# Patient Record
Sex: Male | Born: 1965
Health system: Southern US, Community
[De-identification: ages and names within clinical notes are randomized; demographics above are authoritative.]

## PROBLEM LIST (undated history)

## (undated) DIAGNOSIS — E785 Hyperlipidemia, unspecified: Secondary | ICD-10-CM

## (undated) DIAGNOSIS — E7401 von Gierke disease: Secondary | ICD-10-CM

## (undated) DIAGNOSIS — J309 Allergic rhinitis, unspecified: Secondary | ICD-10-CM

## (undated) DIAGNOSIS — M1990 Unspecified osteoarthritis, unspecified site: Secondary | ICD-10-CM

## (undated) DIAGNOSIS — D551 Anemia due to other disorders of glutathione metabolism: Secondary | ICD-10-CM

## (undated) DIAGNOSIS — K219 Gastro-esophageal reflux disease without esophagitis: Secondary | ICD-10-CM

## (undated) DIAGNOSIS — T7840XA Allergy, unspecified, initial encounter: Secondary | ICD-10-CM

## (undated) DIAGNOSIS — F528 Other sexual dysfunction not due to a substance or known physiological condition: Secondary | ICD-10-CM

## (undated) DIAGNOSIS — I10 Essential (primary) hypertension: Secondary | ICD-10-CM

## (undated) HISTORY — DX: Unspecified osteoarthritis, unspecified site: M19.90

## (undated) HISTORY — PX: NASAL SEPTUM SURGERY: SHX37

## (undated) HISTORY — DX: Gastro-esophageal reflux disease without esophagitis: K21.9

## (undated) HISTORY — DX: Essential (primary) hypertension: I10

## (undated) HISTORY — DX: Allergic rhinitis, unspecified: J30.9

## (undated) HISTORY — DX: Allergy, unspecified, initial encounter: T78.40XA

## (undated) HISTORY — DX: Other sexual dysfunction not due to a substance or known physiological condition: F52.8

## (undated) HISTORY — DX: Anemia due to other disorders of glutathione metabolism: D55.1

## (undated) HISTORY — DX: Von Gierke disease: E74.01

## (undated) HISTORY — PX: KNEE SURGERY: SHX244

## (undated) HISTORY — DX: Hyperlipidemia, unspecified: E78.5

## (undated) HISTORY — PX: FRACTURE SURGERY: SHX138

---

## 2002-07-26 ENCOUNTER — Encounter: Payer: Self-pay | Admitting: Internal Medicine

## 2002-07-26 ENCOUNTER — Ambulatory Visit (HOSPITAL_COMMUNITY): Admission: RE | Admit: 2002-07-26 | Discharge: 2002-07-26 | Payer: Self-pay | Admitting: Internal Medicine

## 2004-04-26 ENCOUNTER — Ambulatory Visit (HOSPITAL_COMMUNITY): Admission: RE | Admit: 2004-04-26 | Discharge: 2004-04-26 | Payer: Self-pay | Admitting: Internal Medicine

## 2004-09-13 ENCOUNTER — Ambulatory Visit: Payer: Self-pay | Admitting: Internal Medicine

## 2004-09-15 ENCOUNTER — Ambulatory Visit (HOSPITAL_COMMUNITY): Admission: RE | Admit: 2004-09-15 | Discharge: 2004-09-15 | Payer: Self-pay | Admitting: Internal Medicine

## 2005-03-30 ENCOUNTER — Ambulatory Visit: Payer: Self-pay | Admitting: Internal Medicine

## 2005-11-16 ENCOUNTER — Ambulatory Visit: Payer: Self-pay | Admitting: Internal Medicine

## 2005-12-02 ENCOUNTER — Ambulatory Visit: Payer: Self-pay | Admitting: Internal Medicine

## 2005-12-07 ENCOUNTER — Encounter: Admission: RE | Admit: 2005-12-07 | Discharge: 2005-12-07 | Payer: Self-pay | Admitting: Specialist

## 2006-12-04 ENCOUNTER — Ambulatory Visit: Payer: Self-pay | Admitting: Internal Medicine

## 2006-12-04 LAB — CONVERTED CEMR LAB
ALT: 23 units/L (ref 0–40)
AST: 27 units/L (ref 0–37)
Albumin: 3.8 g/dL (ref 3.5–5.2)
Alkaline Phosphatase: 67 units/L (ref 39–117)
BUN: 16 mg/dL (ref 6–23)
Basophils Absolute: 0 10*3/uL (ref 0.0–0.1)
Basophils Relative: 0.3 % (ref 0.0–1.0)
Bilirubin Urine: NEGATIVE
Bilirubin, Direct: 0.2 mg/dL (ref 0.0–0.3)
CO2: 29 meq/L (ref 19–32)
Calcium: 9.6 mg/dL (ref 8.4–10.5)
Chloride: 104 meq/L (ref 96–112)
Cholesterol: 184 mg/dL (ref 0–200)
Creatinine, Ser: 1.2 mg/dL (ref 0.4–1.5)
Eosinophils Absolute: 0 10*3/uL (ref 0.0–0.6)
Eosinophils Relative: 1.6 % (ref 0.0–5.0)
GFR calc Af Amer: 86 mL/min
GFR calc non Af Amer: 71 mL/min
Glucose, Bld: 91 mg/dL (ref 70–99)
HCT: 43.7 % (ref 39.0–52.0)
HDL: 44.3 mg/dL (ref >39.0)
Hemoglobin, Urine: NEGATIVE
Hemoglobin: 15 g/dL (ref 13.0–17.0)
Ketones, ur: NEGATIVE mg/dL
LDL Cholesterol: 112 mg/dL — ABNORMAL HIGH (ref 0–99)
Leukocytes, UA: NEGATIVE
Lymphocytes Relative: 27 % (ref 12.0–46.0)
MCHC: 34.4 g/dL (ref 30.0–36.0)
MCV: 93.6 fL (ref 78.0–100.0)
Monocytes Absolute: 0.3 10*3/uL (ref 0.2–0.7)
Monocytes Relative: 10.6 % (ref 3.0–11.0)
Neutro Abs: 1.7 10*3/uL (ref 1.4–7.7)
Neutrophils Relative %: 60.5 % (ref 43.0–77.0)
Nitrite: NEGATIVE
PSA: 0.37 ng/mL
PSA: 0.37 ng/mL
PSA: 0.37 ng/mL (ref 0.10–4.00)
Platelets: 144 10*3/uL — ABNORMAL LOW (ref 150–400)
Potassium: 4.1 meq/L (ref 3.5–5.1)
RBC: 4.67 M/uL (ref 4.22–5.81)
RDW: 11.4 % — ABNORMAL LOW (ref 11.5–14.6)
Sodium: 139 meq/L (ref 135–145)
Specific Gravity, Urine: 1.02 (ref 1.000–1.03)
TSH: 1.34 microintl units/mL (ref 0.35–5.50)
Total Bilirubin: 0.8 mg/dL (ref 0.3–1.2)
Total CHOL/HDL Ratio: 4.2
Total Protein, Urine: NEGATIVE mg/dL
Total Protein: 7.1 g/dL (ref 6.0–8.3)
Triglycerides: 140 mg/dL (ref 0–149)
Urine Glucose: NEGATIVE mg/dL
Urobilinogen, UA: 0.2 (ref 0.0–1.0)
VLDL: 28 mg/dL (ref 0–40)
WBC: 2.7 10*3/uL — ABNORMAL LOW (ref 4.5–10.5)
pH: 5 (ref 5.0–8.0)

## 2007-05-12 ENCOUNTER — Encounter: Payer: Self-pay | Admitting: Internal Medicine

## 2007-05-12 DIAGNOSIS — D551 Anemia due to other disorders of glutathione metabolism: Secondary | ICD-10-CM

## 2007-05-12 DIAGNOSIS — F528 Other sexual dysfunction not due to a substance or known physiological condition: Secondary | ICD-10-CM

## 2007-05-12 DIAGNOSIS — S022XXA Fracture of nasal bones, initial encounter for closed fracture: Secondary | ICD-10-CM | POA: Insufficient documentation

## 2007-05-12 HISTORY — DX: Anemia due to other disorders of glutathione metabolism: D55.1

## 2007-05-12 HISTORY — DX: Other sexual dysfunction not due to a substance or known physiological condition: F52.8

## 2007-05-16 DIAGNOSIS — K219 Gastro-esophageal reflux disease without esophagitis: Secondary | ICD-10-CM

## 2007-05-16 DIAGNOSIS — J309 Allergic rhinitis, unspecified: Secondary | ICD-10-CM | POA: Insufficient documentation

## 2007-05-16 DIAGNOSIS — I10 Essential (primary) hypertension: Secondary | ICD-10-CM

## 2007-05-16 HISTORY — DX: Gastro-esophageal reflux disease without esophagitis: K21.9

## 2007-05-16 HISTORY — DX: Essential (primary) hypertension: I10

## 2007-05-16 HISTORY — DX: Allergic rhinitis, unspecified: J30.9

## 2007-05-29 ENCOUNTER — Ambulatory Visit: Payer: Self-pay | Admitting: Internal Medicine

## 2007-05-30 ENCOUNTER — Encounter: Payer: Self-pay | Admitting: Internal Medicine

## 2008-05-21 ENCOUNTER — Telehealth (INDEPENDENT_AMBULATORY_CARE_PROVIDER_SITE_OTHER): Payer: Self-pay | Admitting: *Deleted

## 2008-09-10 ENCOUNTER — Ambulatory Visit: Payer: Self-pay | Admitting: Internal Medicine

## 2008-09-10 DIAGNOSIS — E785 Hyperlipidemia, unspecified: Secondary | ICD-10-CM

## 2008-09-10 DIAGNOSIS — J019 Acute sinusitis, unspecified: Secondary | ICD-10-CM | POA: Insufficient documentation

## 2008-09-10 HISTORY — DX: Hyperlipidemia, unspecified: E78.5

## 2008-09-10 LAB — CONVERTED CEMR LAB
ALT: 19 units/L (ref 0–53)
AST: 27 units/L (ref 0–37)
Albumin: 3.9 g/dL (ref 3.5–5.2)
Alkaline Phosphatase: 55 units/L (ref 39–117)
BUN: 14 mg/dL (ref 6–23)
Basophils Absolute: 0 10*3/uL (ref 0.0–0.1)
Basophils Relative: 0.8 % (ref 0.0–3.0)
Bilirubin Urine: NEGATIVE
Bilirubin, Direct: 0.2 mg/dL (ref 0.0–0.3)
CO2: 32 meq/L (ref 19–32)
Calcium: 9.6 mg/dL (ref 8.4–10.5)
Chloride: 103 meq/L (ref 96–112)
Cholesterol: 159 mg/dL (ref 0–200)
Creatinine, Ser: 1.3 mg/dL (ref 0.4–1.5)
Eosinophils Absolute: 0.1 10*3/uL (ref 0.0–0.7)
Eosinophils Relative: 3.1 % (ref 0.0–5.0)
GFR calc Af Amer: 78 mL/min
GFR calc non Af Amer: 64 mL/min
Glucose, Bld: 102 mg/dL — ABNORMAL HIGH (ref 70–99)
HCT: 41.9 % (ref 39.0–52.0)
HDL: 48.8 mg/dL (ref >39.0)
Hemoglobin, Urine: NEGATIVE
Hemoglobin: 15.1 g/dL (ref 13.0–17.0)
Ketones, ur: NEGATIVE mg/dL
LDL Cholesterol: 97 mg/dL (ref 0–99)
Leukocytes, UA: NEGATIVE
Lymphocytes Relative: 24.1 % (ref 12.0–46.0)
MCHC: 36 g/dL (ref 30.0–36.0)
MCV: 96.4 fL (ref 78.0–100.0)
Monocytes Absolute: 0.3 10*3/uL (ref 0.1–1.0)
Monocytes Relative: 7.8 % (ref 3.0–12.0)
Neutro Abs: 2.9 10*3/uL (ref 1.4–7.7)
Neutrophils Relative %: 64.2 % (ref 43.0–77.0)
Nitrite: NEGATIVE
PSA: 0.47 ng/mL (ref 0.10–4.00)
Platelets: 162 10*3/uL (ref 150–400)
Potassium: 4.6 meq/L (ref 3.5–5.1)
RBC: 4.34 M/uL (ref 4.22–5.81)
RDW: 11.1 % — ABNORMAL LOW (ref 11.5–14.6)
Sodium: 141 meq/L (ref 135–145)
Specific Gravity, Urine: <=1.005 (ref 1.000–1.03)
TSH: 0.54 microintl units/mL (ref 0.35–5.50)
Total Bilirubin: 1.1 mg/dL (ref 0.3–1.2)
Total CHOL/HDL Ratio: 3.3
Total Protein, Urine: NEGATIVE mg/dL
Total Protein: 7 g/dL (ref 6.0–8.3)
Triglycerides: 64 mg/dL (ref 0–149)
Urine Glucose: NEGATIVE mg/dL
Urobilinogen, UA: 0.2 (ref 0.0–1.0)
VLDL: 13 mg/dL (ref 0–40)
WBC: 4.3 10*3/uL — ABNORMAL LOW (ref 4.5–10.5)
pH: 6.5 (ref 5.0–8.0)

## 2009-09-09 ENCOUNTER — Ambulatory Visit: Payer: Self-pay | Admitting: Internal Medicine

## 2010-02-23 ENCOUNTER — Ambulatory Visit: Payer: Self-pay | Admitting: Internal Medicine

## 2010-02-23 DIAGNOSIS — M654 Radial styloid tenosynovitis [de Quervain]: Secondary | ICD-10-CM | POA: Insufficient documentation

## 2010-03-01 ENCOUNTER — Encounter: Payer: Self-pay | Admitting: Internal Medicine

## 2010-03-01 ENCOUNTER — Telehealth: Payer: Self-pay | Admitting: Internal Medicine

## 2010-03-02 ENCOUNTER — Encounter: Payer: Self-pay | Admitting: Internal Medicine

## 2010-10-05 NOTE — Progress Notes (Signed)
Summary: Work note  Phone Note Call from Patient   Caller: Patient Summary of Call: Pt is requesting a new work note that will allow him to return to work without restriction starting tomorrow. Per pt his employer will not allow him to work with injury and there is no light duty. Pt says that he has an appointment with Ortho Wed 06/29 and if MD wants him to stay out of work he will but right now he can not afford to miss work.  Pt states that his wrist is still sore but with some swelling, not as bad as last week. Okay for release? Initial call taken by: Margaret Pyle, CMA,  March 01, 2010 3:26 PM  Follow-up for Phone Call        ok to return to work - I will do letter - done hardcopy to LIM side B - dahlia  Follow-up by: Corwin Levins MD,  March 01, 2010 5:26 PM  Additional Follow-up for Phone Call Additional follow up Details #1::        Pt informed, letter in cabinet for pt pick up Additional Follow-up by: Margaret Pyle, CMA,  March 02, 2010 7:55 AM

## 2010-10-05 NOTE — Letter (Signed)
Summary: Generic Letter  [facility]  [address] [place], [place] [address]   Phone: [phone]  Fax: [phone]    02/23/2010  KRISTA SOM [address] [place], [place]  [address]  Dear Mr. HAEN,       This letter to state that you should be allowed  to do light duty only for 10 calendar days, starting  February 24, 2010.  This means no lifting or pulling more  than 20 lbs.     Sincerely,   Oliver Barre MD

## 2010-10-05 NOTE — Assessment & Plan Note (Signed)
Summary: WRIST PAIN/NWS  #   Vital Signs:  Patient profile:   45 year old male Height:      69 inches (175.26 cm) Weight:      207.0 pounds (94.09 kg) BMI:     30.68 O2 Sat:      96 % on Room air Temp:     99.2 degrees F (37.33 degrees C) oral Pulse rate:   78 / minute BP sitting:   136 / 88  (left arm) Cuff size:   large  Vitals Entered By: Orlan Leavens (February 23, 2010 3:30 PM)  O2 Flow:  Room air  CC: (R) Wrist pain Is Patient Diabetic? No Pain Assessment Patient in pain? yes     Location: (R) wrist Type: aching/swollen Comments Pt states he is out of micardis. Have'nt been taking   CC:  (R) Wrist pain.  History of Present Illness: here with acute onset 1wk mild to mod pain and swelling to the right thumb and wrist with very physical job at work using hands and arms;  Pt denies CP, sob, doe, wheezing, orthopnea, pnd, worsening LE edema, palps, dizziness or syncope  Pt denies new neuro symptoms such as headache, facial or extremity weakness   Overall good med compliacne with meds, good tolerance.    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 1 Tab Daily 2)  Cephalexin 500 Mg Tabs (Cephalexin) .Marland Kitchen.. 1 By Mouth Three Times A Day  Current Medications (verified): 1)  Micardis 80 Mg Tabs (Telmisartan) .Marland Kitchen.. 1 Tab Daily 2)  Tramadol Hcl 50 Mg Tabs (Tramadol Hcl) .Marland Kitchen.. 1po Q 6 Hrs As Needed Pain 3)  Prednisone 10 Mg Tabs (Prednisone) .... 3po Qd For 3days, Then 2po Qd For 3days, Then 1po Qd For 3days, Then Stop  Allergies (verified): 1)  ! Asa 2)   ! Sulfa  Past History:  Past Medical History: Last updated: 09/10/2008 Hypertension Hx of G6PD deficiency Allergic rhinitis GERD Erectile Dysfunction Hx of Nose Fracture left AC joint DJD Hyperlipidemia hx of left knee meniscus tear  Past Surgical History: Last updated: 05/30/2007 Repair of deviated septum- age 38  Social History: Last updated: 09/10/2008 work - plant foreman - [employer] 3 children Married/separated Alcohol use-yes Never Smoked  Risk Factors: Smoking Status: never (09/10/2008)  Review of Systems       all otherwise negative per pt -    Physical Exam  General:  alert and well-developed - very muscular Head:  normocephalic and atraumatic.   Eyes:  vision grossly intact, pupils equal, and pupils round.   Ears:  R ear normal and L ear normal.   Nose:  no external deformity and no nasal discharge.   Mouth:  no gingival abnormalities and pharynx pink and moist.   Neck:  supple and no masses.   Lungs:  normal respiratory effort and normal breath sounds.   Heart:  normal rate and regular rhythm.   Msk:  right thumb and wrist with marked tender and swelling and warmth to the first dorsal compartment only - o/w neurovasc intact and no other MSK  abnormal Extremities:  no edema, no erythema  Neurologic:  strength normal in upper extremities and sensation intact to light touch.     Impression & Recommendations:  Problem # 1:  DE QUERVAIN'S TENOSYNOVITIS (ICD-727.04)  at least moderate to the right;  for light duty for 10 days, tx with prednisone taper off, and tramadol as needed;  for hand surgury eval as well as is likely to recur  Orders: Orthopedic Surgeon Referral (Ortho Surgeon)  Problem # 2:  HYPERTENSION (ICD-401.9)  His updated medication list for this problem includes:    Micardis 80 Mg Tabs (Telmisartan) .Marland Kitchen... 1 tab daily stable overall by hx and exam, ok to continue meds/tx as is   BP today: 136/88 Prior BP: 132/90  (09/10/2008)  Labs Reviewed: K+: 4.6 (09/10/2008) Creat: : 1.3 (09/10/2008)   Chol: 159 (09/10/2008)   HDL: 48.8 (09/10/2008)   LDL: 97 (09/10/2008)   TG: 64 (09/10/2008)  Complete Medication List: 1)  Micardis 80 Mg Tabs (Telmisartan) .Marland Kitchen.. 1 tab daily 2)  Tramadol Hcl 50 Mg Tabs (Tramadol hcl) .Marland Kitchen.. 1po q 6 hrs as needed pain 3)  Prednisone 10 Mg Tabs (Prednisone) .... 3po qd for 3days, then 2po qd for 3days, then 1po qd for 3days, then stop  Patient Instructions: 1)  Please take all new medications as prescribed 2)  Continue all previous medications as before this visit  3)  you are given the work note for light duty for 10 days 4)  Please schedule a follow-up appointment in 3 months with CPX labs Prescriptions: PREDNISONE 10 MG TABS (PREDNISONE) 3po qd for 3days, then 2po qd for 3days, then 1po qd for 3days, then stop  #18 x 0   Entered and Authorized by:   Corwin Levins MD   Signed by:   Corwin Levins MD on 02/23/2010   Method used:   Print then Give to Patient   RxID:   [id number] TRAMADOL HCL 50 MG TABS (TRAMADOL HCL) 1po q 6 hrs as needed pain  #50 x 1   Entered and Authorized by:   Corwin Levins MD   Signed by:   Corwin Levins MD on 02/23/2010   Method used:   Print then Give to Patient   RxID:   [id number]

## 2010-10-05 NOTE — Consult Note (Signed)
Summary: [facility]   Imported By: Sherian Rein 03/12/2010 08:19:48  _____________________________________________________________________  External Attachment:    Type:   Image     Comment:   External Document

## 2010-10-05 NOTE — Letter (Signed)
Summary: Generic Letter  [facility]  [address] [place], [place] [address]   Phone: [phone]  Fax: [phone]    03/01/2010  [facility] [address] [place], [place]  [address]  Dear Mr. MURATA,        You are OK to return to work as of March 02, 2010  without restriction.    Sincerely,   Oliver Barre MD

## 2011-12-10 ENCOUNTER — Encounter: Payer: Self-pay | Admitting: Internal Medicine

## 2011-12-10 DIAGNOSIS — Z0000 Encounter for general adult medical examination without abnormal findings: Secondary | ICD-10-CM

## 2011-12-10 DIAGNOSIS — Z0001 Encounter for general adult medical examination with abnormal findings: Secondary | ICD-10-CM | POA: Insufficient documentation

## 2011-12-15 ENCOUNTER — Ambulatory Visit (INDEPENDENT_AMBULATORY_CARE_PROVIDER_SITE_OTHER): Payer: BC Managed Care – PPO | Admitting: Internal Medicine

## 2011-12-15 ENCOUNTER — Encounter: Payer: Self-pay | Admitting: Internal Medicine

## 2011-12-15 ENCOUNTER — Telehealth: Payer: Self-pay

## 2011-12-15 ENCOUNTER — Other Ambulatory Visit (INDEPENDENT_AMBULATORY_CARE_PROVIDER_SITE_OTHER): Payer: BC Managed Care – PPO

## 2011-12-15 VITALS — BP 122/70 | HR 58 | Temp 99.1°F | Ht 72.0 in | Wt 205.5 lb

## 2011-12-15 DIAGNOSIS — Z0000 Encounter for general adult medical examination without abnormal findings: Secondary | ICD-10-CM

## 2011-12-15 LAB — CBC WITH DIFFERENTIAL/PLATELET
Basophils Relative: 0.8 % (ref 0.0–3.0)
Eosinophils Absolute: 0 10*3/uL (ref 0.0–0.7)
HCT: 42.4 % (ref 39.0–52.0)
Hemoglobin: 14.4 g/dL (ref 13.0–17.0)
Lymphs Abs: 1 10*3/uL (ref 0.7–4.0)
MCHC: 33.9 g/dL (ref 30.0–36.0)
MCV: 95.9 fl (ref 78.0–100.0)
Monocytes Absolute: 0.2 10*3/uL (ref 0.1–1.0)
Neutro Abs: 1.6 10*3/uL (ref 1.4–7.7)
RBC: 4.42 Mil/uL (ref 4.22–5.81)
RDW: 11.9 % (ref 11.5–14.6)

## 2011-12-15 LAB — LIPID PANEL
Total CHOL/HDL Ratio: 3
Triglycerides: 25 mg/dL (ref 0.0–149.0)

## 2011-12-15 LAB — BASIC METABOLIC PANEL
BUN: 30 mg/dL — ABNORMAL HIGH (ref 6–23)
Calcium: 9.3 mg/dL (ref 8.4–10.5)
Chloride: 101 mEq/L (ref 96–112)
Creatinine, Ser: 1.3 mg/dL (ref 0.4–1.5)

## 2011-12-15 LAB — URINALYSIS, ROUTINE W REFLEX MICROSCOPIC
Bilirubin Urine: NEGATIVE
Hgb urine dipstick: NEGATIVE
Leukocytes, UA: NEGATIVE
Nitrite: NEGATIVE
pH: 6 (ref 5.0–8.0)

## 2011-12-15 LAB — HEPATIC FUNCTION PANEL
ALT: 33 U/L (ref 0–53)
Total Bilirubin: 1.2 mg/dL (ref 0.3–1.2)

## 2011-12-15 LAB — PSA: PSA: 0.34 ng/mL (ref 0.10–4.00)

## 2011-12-15 LAB — TSH: TSH: 1.47 u[IU]/mL (ref 0.35–5.50)

## 2011-12-15 NOTE — Patient Instructions (Signed)
No new medications needed today. You are otherwise up to date with prevention as well Please return in 1 year for your yearly visit, or sooner if needed, with Lab testing done 3-5 days before

## 2011-12-15 NOTE — Progress Notes (Signed)
Subjective:    Patient ID: Jimmy Hess, male    DOB: [birth year], 46 y.o.   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  Pt denies neurological change such as new Headache, facial or extremity weakness.  Pt denies polydipsia, polyuria, or low sugar symptoms. Pt states overall good compliance with treatment and medications, good tolerability, and trying to follow lower cholesterol diet.  Pt denies worsening depressive symptoms, suicidal ideation or panic. No fever, wt loss, night sweats, loss of appetite, or other constitutional symptoms.  Pt states good ability with ADL's, low fall risk, home safety reviewed and adequate, no significant changes in hearing or vision.   Past Medical History  Diagnosis Date  . HYPERTENSION 05/16/2007    Qualifier: Diagnosis of  By: Jonny Ruiz MD, Len Blalock   . HYPERLIPIDEMIA 09/10/2008    Qualifier: Diagnosis of  By: Jonny Ruiz MD, Len Blalock   . GERD 05/16/2007    Qualifier: Diagnosis of  By: Jonny Ruiz MD, Len Blalock   . ALLERGIC RHINITIS 05/16/2007    Qualifier: Diagnosis of  By: Jonny Ruiz MD, Len Blalock   . GLUCOSE-6-PHOSPHATE DEHYDROGENASE DEFICIENCY 05/12/2007    Qualifier: Diagnosis of  By: Maris Berger   . ERECTILE DYSFUNCTION 05/12/2007    Qualifier: Diagnosis of  By: Jonny Ruiz MD, Len Blalock    Past Surgical History  Procedure Date  . Nasal septum surgery     reports that he has never smoked. He does not have any smokeless tobacco history on file. He reports that he drinks alcohol. He reports that he does not use illicit drugs. family history includes Cancer in his father; Heart failure in his maternal grandmother; and Hypertension in his maternal grandmother and mother. Allergies  Allergen  Reactions  . Aspirin   . Sulfonamide Derivatives    No current outpatient prescriptions on file prior to visit.   Review of Systems Review of Systems  Constitutional: Negative for diaphoresis, activity change, appetite change and unexpected weight change.  HENT: Negative for hearing loss, ear pain, facial swelling, mouth sores and neck stiffness.   Eyes: Negative for pain, redness and visual disturbance.  Respiratory: Negative for shortness of breath and wheezing.   Cardiovascular: Negative for chest pain and palpitations.  Gastrointestinal: Negative for diarrhea, blood in stool, abdominal distention and rectal pain.  Genitourinary: Negative for hematuria, flank pain and decreased urine volume.  Musculoskeletal: Negative for myalgias and joint swelling.  Skin: Negative for color change and wound.  Neurological: Negative for syncope and numbness.  Hematological: Negative for adenopathy.  Psychiatric/Behavioral: Negative for hallucinations, self-injury, decreased concentration and agitation.      Objective:   Physical Exam BP 122/70  Pulse 58  Temp(Src) 99.1 F (37.3 C) (Oral)  Ht 6' (1.829 m)  Wt 205 lb 8 oz (93.214 kg)  BMI 27.87 kg/m2  SpO2 97% Physical Exam  VS noted Constitutional: Pt is oriented to person, place, and time. Appears well-developed and well-nourished. /muscular/nonobese HENT:  Head: Normocephalic and atraumatic.  Right Ear: External ear normal.  Left Ear: External ear normal.  Nose: Nose normal.  Mouth/Throat: Oropharynx is clear and moist.  Eyes: Conjunctivae and EOM are normal. Pupils are equal, round, and reactive to light.  Neck: Normal range of  motion. Neck supple. No JVD present. No tracheal deviation present.  Cardiovascular: Normal rate, regular rhythm, normal heart sounds and intact distal pulses.   Pulmonary/Chest: Effort normal and breath sounds normal.  Abdominal: Soft. Bowel sounds are normal. There is no tenderness.  Musculoskeletal: Normal  range of motion. Exhibits no edema.  Lymphadenopathy:  Has no cervical adenopathy.  Neurological: Pt is alert and oriented to person, place, and time. Pt has normal reflexes. No cranial nerve deficit.  Skin: Skin is warm and dry. No rash noted.  Psychiatric:  Has  normal mood and affect. Behavior is normal.     Assessment & Plan:

## 2011-12-15 NOTE — Assessment & Plan Note (Addendum)
Overall doing well, age appropriate education and counseling updated, referrals for preventative services and immunizations addressed, dietary and smoking counseling addressed, most recent labs and ECG reviewed.  I have personally reviewed and have noted: 1) the patient's medical and social history 2) The pt's use of alcohol, tobacco, and illicit drugs 3) The patient's current medications and supplements 4) Functional ability including ADL's, fall risk, home safety risk, hearing and visual impairment 5) Diet and physical activities 6) Evidence for depression or mood disorder 7) The patient's height, weight, and BMI have been recorded in the chart I have made referrals, and provided counseling and education based on review of the above ECG reviewed as per emr Lab Results  Component Value Date   WBC 2.9* 12/15/2011   HGB 14.4 12/15/2011   HCT 42.4 12/15/2011   PLT 130.0* 12/15/2011   GLUCOSE 82 12/15/2011   CHOL 116 12/15/2011   TRIG 25.0 12/15/2011   HDL 41.50 12/15/2011   LDLCALC 70 12/15/2011   ALT 33 12/15/2011   AST 34 12/15/2011   NA 137 12/15/2011   K 4.4 12/15/2011   CL 101 12/15/2011   CREATININE 1.3 12/15/2011   BUN 30* 12/15/2011   CO2 29 12/15/2011   TSH 1.47 12/15/2011   PSA 0.34 12/15/2011

## 2011-12-15 NOTE — Telephone Encounter (Signed)
Put lab order in for physical labs. 

## 2012-05-15 ENCOUNTER — Ambulatory Visit: Payer: BC Managed Care – PPO | Admitting: Internal Medicine

## 2012-07-02 ENCOUNTER — Telehealth: Payer: Self-pay | Admitting: Internal Medicine

## 2012-07-02 ENCOUNTER — Ambulatory Visit (INDEPENDENT_AMBULATORY_CARE_PROVIDER_SITE_OTHER)
Admission: RE | Admit: 2012-07-02 | Discharge: 2012-07-02 | Disposition: A | Discharge status: Home / Self Care | Payer: BC Managed Care – PPO | Source: Ambulatory Visit | Attending: Internal Medicine | Admitting: Internal Medicine

## 2012-07-02 ENCOUNTER — Ambulatory Visit (INDEPENDENT_AMBULATORY_CARE_PROVIDER_SITE_OTHER): Payer: BC Managed Care – PPO | Admitting: Internal Medicine

## 2012-07-02 ENCOUNTER — Encounter: Payer: Self-pay | Admitting: Internal Medicine

## 2012-07-02 VITALS — BP 122/80 | HR 75 | Temp 100.0°F | Ht 72.0 in | Wt 231.1 lb

## 2012-07-02 DIAGNOSIS — M25572 Pain in left ankle and joints of left foot: Secondary | ICD-10-CM

## 2012-07-02 DIAGNOSIS — L0390 Cellulitis, unspecified: Secondary | ICD-10-CM | POA: Insufficient documentation

## 2012-07-02 DIAGNOSIS — M25579 Pain in unspecified ankle and joints of unspecified foot: Secondary | ICD-10-CM

## 2012-07-02 DIAGNOSIS — S82409A Unspecified fracture of shaft of unspecified fibula, initial encounter for closed fracture: Secondary | ICD-10-CM

## 2012-07-02 DIAGNOSIS — L0291 Cutaneous abscess, unspecified: Secondary | ICD-10-CM

## 2012-07-02 DIAGNOSIS — I10 Essential (primary) hypertension: Secondary | ICD-10-CM

## 2012-07-02 MED ORDER — CEPHALEXIN 500 MG PO CAPS: 500.0000 mg | ORAL_CAPSULE | Freq: Four times a day (QID) | ORAL | Status: DC

## 2012-07-02 MED ORDER — TRAMADOL HCL 50 MG PO TABS: 50.0000 mg | ORAL_TABLET | Freq: Four times a day (QID) | ORAL | Status: DC | PRN

## 2012-07-02 NOTE — Progress Notes (Signed)
Subjective:    Patient ID: Jimmy Hess, male    DOB: Aug 19, 1966, 46 y.o.   MRN: [id number]  HPI Here after fall out of a deer stand 2 days ago, catching the pant leg on the way and severe inversion of the left ankle.  Has used ACE wrap and icing several times per day, went to UC yesterday but wait was 2 hrs so did not stay.  Today with mod to severe pain on ambulation with marked swelling, worse since yesterday also with redness, tenderness going up the left distal leg above the ankle as well.  Unaware of any fever, no chills. Pt denies chest pain, increased sob or doe, wheezing, orthopnea, PND, increased LE swelling, palpitations, dizziness or syncope.  Pt denies new neurological symptoms such as new headache, or facial or extremity weakness or numbness  Has no hx of similar in the past.  No signficant other ortho issues Past Medical History  Diagnosis Date  . HYPERTENSION 05/16/2007    Qualifier: Diagnosis of  By: Jonny Ruiz MD, Len Blalock   . HYPERLIPIDEMIA 09/10/2008    Qualifier: Diagnosis of  By: Jonny Ruiz MD, Len Blalock   . GERD 05/16/2007    Qualifier: Diagnosis of  By: Jonny Ruiz MD, Len Blalock   . ALLERGIC RHINITIS 05/16/2007    Qualifier: Diagnosis of  By: Jonny Ruiz MD, Len Blalock   . GLUCOSE-6-PHOSPHATE DEHYDROGENASE DEFICIENCY 05/12/2007    Qualifier: Diagnosis of  By: Maris Berger   . ERECTILE DYSFUNCTION 05/12/2007    Qualifier: Diagnosis of  By: Jonny Ruiz MD, Len Blalock    Past Surgical History  Procedure Date  . Nasal septum surgery     reports that he has never smoked. He does not have any smokeless tobacco history on file. He reports that he drinks alcohol. He reports that he does not use illicit drugs. family history includes Cancer in his father; Heart failure in his maternal grandmother; and Hypertension in his maternal grandmother and mother. Allergies  Allergen Reactions  . Aspirin   . Sulfonamide Derivatives    No current outpatient prescriptions on file prior to visit.   Review of Systems  Constitutional: Negative for diaphoresis and unexpected weight change.  HENT: Negative for tinnitus.   Eyes: Negative for photophobia and visual disturbance.  Respiratory: Negative for choking and stridor.   Gastrointestinal: Negative for vomiting and blood in stool.  Genitourinary: Negative for hematuria and decreased urine volume.  Musculoskeletal: Negative for gait problem.  Skin: Negative for color change and wound.  Neurological: Negative for tremors and numbness.  Psychiatric/Behavioral: Negative for decreased concentration. The patient is not hyperactive.       Objective:   Physical Exam BP 122/80  Pulse 75  Temp 100 F (37.8 C) (Oral)  Ht 6' (1.829 m)  Wt 231 lb 2 oz (104.838 kg)  BMI 31.35 kg/m2  SpO2 97% Physical Exam  VS noted Constitutional: Pt appears well-developed and well-nourished.  HENT: Head: Normocephalic.  Right Ear: External ear normal.  Left Ear: External ear normal.  Eyes: Conjunctivae and EOM are normal. Pupils are equal, round, and reactive to light.  Neck: Normal range of motion. Neck supple.  Cardiovascular: Normal rate and regular rhythm.   Pulmonary/Chest: Effort normal and breath sounds normal.  Abd:  Soft, NT, non-distended, + BS Neurological: Pt is alert. Not confused  Skin: Skin is warm. No erythema.  Psychiatric: Pt behavior is normal. Thought content normal.  Left ankle with severe swelling/tender worst over the lateral malleolus with  some mild bruising; also some erythema to the area extending to the lower third of the lateral distal leg as well;  LLE o/w neurovasc intact; marked limp to walking, crutches are in the car    Assessment & Plan:

## 2012-07-02 NOTE — Assessment & Plan Note (Signed)
stable overall by hx and exam, most recent data reviewed with pt, and pt to continue medical treatment as before BP Readings from Last 3 Encounters:  07/02/12 122/80  12/15/11 122/70  02/23/10 136/88

## 2012-07-02 NOTE — Patient Instructions (Addendum)
Take all new medications as prescribed  - the pain medication, and antibiotic Continue all other medications as before Please use the ice three times per day at 15 min each to the left lateral ankle for another day Please continue the ACE wrap, and you could consider a soft shoe cast at the pharmacy or medical supply (such as [facility] on [address]) Please go to XRAY in the Basement for the x-ray test You will be contacted by phone if any changes need to be made immediately.  Otherwise, you will receive a letter about your results with an explanation. Please remember to sign up for My Chart at your earliest convenience, as this will be important to you in the future with finding out test results. If there is a fracture, you will be called with referral to orthopedic You are given the work note today

## 2012-07-02 NOTE — Assessment & Plan Note (Signed)
Mild to mod, for antibx course,  to f/u any worsening symptoms or concerns 

## 2012-07-02 NOTE — Assessment & Plan Note (Signed)
Likely severe sprain/strain vs ankle fx - for film today, pain med, consider ortho

## 2012-07-02 NOTE — Telephone Encounter (Signed)
Caller: Mckade/Patient; PCP: Oliver Barre (Adults only); CB#: [phone]; Call regarding Injury/Trauma;   Today, 07/02/2012 , pt calling wanting appt to be seen stating on 06/30/2012  he fell out of tree stand and foot got caught in foot peg. He has  left ankle / foot swelling with pain. Is able to walk , but not putting full weight. He has  been placing ice on  the ankle and  wrapped with  ace bandage. RN reached See in 4 hours for new marked swelling per Foot Injury protocol. Pt stating he already has a appt with his allergist at 1145 this morning. RN scheduled appt today at 1115 with Dr. Jonny Ruiz and pt stated he will call to rearrange his allergist  appt and call back if needed.

## 2012-12-17 ENCOUNTER — Encounter: Payer: BC Managed Care – PPO | Admitting: Internal Medicine

## 2012-12-17 DIAGNOSIS — Z0289 Encounter for other administrative examinations: Secondary | ICD-10-CM

## 2013-06-20 ENCOUNTER — Encounter: Payer: Self-pay | Admitting: Internal Medicine

## 2013-06-20 ENCOUNTER — Ambulatory Visit (INDEPENDENT_AMBULATORY_CARE_PROVIDER_SITE_OTHER): Payer: BC Managed Care – PPO | Admitting: Internal Medicine

## 2013-06-20 ENCOUNTER — Other Ambulatory Visit (INDEPENDENT_AMBULATORY_CARE_PROVIDER_SITE_OTHER): Payer: BC Managed Care – PPO

## 2013-06-20 VITALS — BP 114/84 | HR 67 | Temp 99.0°F | Ht 72.0 in | Wt 215.4 lb

## 2013-06-20 DIAGNOSIS — Z0000 Encounter for general adult medical examination without abnormal findings: Secondary | ICD-10-CM

## 2013-06-20 DIAGNOSIS — R040 Epistaxis: Secondary | ICD-10-CM

## 2013-06-20 DIAGNOSIS — J309 Allergic rhinitis, unspecified: Secondary | ICD-10-CM

## 2013-06-20 LAB — CBC WITH DIFFERENTIAL/PLATELET
Basophils Absolute: 0 10*3/uL (ref 0.0–0.1)
Basophils Relative: 0.4 % (ref 0.0–3.0)
Eosinophils Absolute: 0.1 10*3/uL (ref 0.0–0.7)
Eosinophils Relative: 2.8 % (ref 0.0–5.0)
HCT: 40.7 % (ref 39.0–52.0)
Lymphocytes Relative: 36 % (ref 12.0–46.0)
Lymphs Abs: 1.3 10*3/uL (ref 0.7–4.0)
MCV: 93.1 fl (ref 78.0–100.0)
Monocytes Relative: 11 % (ref 3.0–12.0)
Neutrophils Relative %: 49.8 % (ref 43.0–77.0)
RBC: 4.37 Mil/uL (ref 4.22–5.81)
RDW: 12 % (ref 11.5–14.6)
WBC: 3.7 10*3/uL — ABNORMAL LOW (ref 4.5–10.5)

## 2013-06-20 MED ORDER — PREDNISONE 10 MG PO TABS: ORAL_TABLET | ORAL | Status: DC

## 2013-06-20 MED ORDER — METHYLPREDNISOLONE ACETATE 80 MG/ML IJ SUSP
80.0000 mg | Freq: Once | INTRAMUSCULAR | Status: AC
  Administered 2013-06-20: 80 mg via INTRAMUSCULAR

## 2013-06-20 NOTE — Patient Instructions (Signed)
You had the steroid shot today Please take all new medication as prescribed - the prednisone Please continue all other medications as before, except ok to stop the patanase You will be contacted regarding the referral for: ENT for the nosebleeds Please keep your appointments with your specialists as you have planned - [facility] Please go to the LAB in the Basement (turn left off the elevator) for the tests to be done today You will be contacted by phone if any changes need to be made immediately.  Otherwise, you will receive a letter about your results with an explanation, but please check with MyChart first.  Please remember to sign up for My Chart if you have not done so, as this will be important to you in the future with finding out test results, communicating by private email, and scheduling acute appointments online when needed.  Please return in 1 year for your yearly visit, or sooner if needed

## 2013-06-20 NOTE — Assessment & Plan Note (Signed)
Recurrent right side, for ENT referral

## 2013-06-20 NOTE — Assessment & Plan Note (Signed)

## 2013-06-20 NOTE — Assessment & Plan Note (Signed)
Mild to mod, for depomedrol IM, predpack asd,  to f/u any worsening symptoms or concerns 

## 2013-06-20 NOTE — Progress Notes (Signed)
Subjective:    Patient ID: Jimmy Hess, male    DOB: 12/22/1965, 48 y.o.   MRN: [id number]  HPI  Here for wellness and f/u;  Overall doing ok;  Pt denies CP, worsening SOB, DOE, wheezing, orthopnea, PND, worsening LE edema, palpitations, dizziness or syncope.  Pt denies neurological change such as new headache, facial or extremity weakness.  Pt denies polydipsia, polyuria, or low sugar symptoms. Pt states overall good compliance with treatment and medications, good tolerability, and has been trying to follow lower cholesterol diet.  Pt denies worsening depressive symptoms, suicidal ideation or panic. No fever, night sweats, wt loss, loss of appetite, or other constitutional symptoms.  Pt states good ability with ADL's, has low fall risk, home safety reviewed and adequate, no other significant changes in hearing or vision, and only occasionally active with exercise.  Also with c/o recurring right sided nosebleeds for several months, now daily for last few wks.   Very irritated nasal since using certain chemicals at work  - asphalt dust. Also Does have several wks ongoing nasal allergy symptoms with clearish congestion, itch and sneezing, without fever, pain, ST, cough, swelling or wheezing.  Has seen allergy, patanase not working well.  Pt denies chest pain, increased sob or doe, wheezing, orthopnea, PND, increased LE swelling, palpitations, dizziness or syncope.  Past Medical History  Diagnosis Date  . HYPERTENSION 05/16/2007    Qualifier: Diagnosis of  By: Jonny Ruiz MD, Len Blalock   . HYPERLIPIDEMIA 09/10/2008    Qualifier: Diagnosis of  By: Jonny Ruiz MD, Len Blalock   . GERD 05/16/2007    Qualifier: Diagnosis of  By: Jonny Ruiz MD, Len Blalock   . ALLERGIC RHINITIS 05/16/2007    Qualifier: Diagnosis of  By: Jonny Ruiz MD, Len Blalock   . GLUCOSE-6-PHOSPHATE DEHYDROGENASE DEFICIENCY 05/12/2007    Qualifier: Diagnosis of  By: Maris Berger   . ERECTILE DYSFUNCTION 05/12/2007    Qualifier: Diagnosis of  By: Jonny Ruiz MD, Len Blalock     Past Surgical History  Procedure Laterality Date  . Nasal septum surgery      reports that he has never smoked. He does not have any smokeless tobacco history on file. He reports that he drinks alcohol. He reports that he does not use illicit drugs. family history includes Cancer in his father; Heart failure in his maternal grandmother; Hypertension in his maternal grandmother and mother. Allergies  Allergen Reactions  . Aspirin   . Sulfonamide Derivatives    No current outpatient prescriptions on file prior to visit.   No current facility-administered medications on file prior to visit.    Review of Systems  Constitutional: Negative for diaphoresis, activity change, appetite change or unexpected weight change.  HENT: Negative for hearing loss, ear pain, facial swelling, mouth sores and neck stiffness.   Eyes: Negative for pain, redness and visual disturbance.  Respiratory: Negative for shortness of breath and wheezing.   Cardiovascular: Negative for chest pain and palpitations.  Gastrointestinal: Negative for diarrhea, blood in stool, abdominal distention or other pain Genitourinary: Negative for hematuria, flank pain or change in urine volume.  Musculoskeletal: Negative for myalgias and joint swelling.  Skin: Negative for color change and wound.  Neurological: Negative for syncope and numbness. other than noted Hematological: Negative for adenopathy.  Psychiatric/Behavioral: Negative for hallucinations, self-injury, decreased concentration and agitation.        Objective:   Physical Exam BP 114/84  Pulse 67  Temp(Src) 99 F (37.2 C) (Oral)  Ht 6' (1.829 m)  Wt 215 lb 6 oz (97.693 kg)  BMI 29.2 kg/m2  SpO2 97% VS noted, not ill appearing Constitutional: Pt appears well-developed and well-nourished.  HENT: Head: NCAT.  Right Ear: External ear normal.  Left Ear: External ear normal.  Eyes: Conjunctivae and EOM are normal. Pupils are equal, round, and reactive to  light.  Bilat tm's with mild erythema.  Max sinus areas non tender.  Pharynx with mild erythema, no exudate Neck: Normal range of motion. Neck supple.  Cardiovascular: Normal rate and regular rhythm.   Pulmonary/Chest: Effort normal and breath sounds normal.  Neurological: Pt is alert. Not confused  Skin: Skin is warm. No erythema.  Psychiatric: Pt behavior is normal. Thought content normal.     Assessment & Plan:

## 2013-06-21 ENCOUNTER — Encounter: Payer: Self-pay | Admitting: Internal Medicine

## 2013-06-21 ENCOUNTER — Ambulatory Visit: Payer: BC Managed Care – PPO | Admitting: Internal Medicine

## 2013-06-21 LAB — URINALYSIS, ROUTINE W REFLEX MICROSCOPIC
Hgb urine dipstick: NEGATIVE
Leukocytes, UA: NEGATIVE
Nitrite: NEGATIVE
Specific Gravity, Urine: >=1.03 (ref 1.000–1.030)
Total Protein, Urine: NEGATIVE
pH: 6 (ref 5.0–8.0)

## 2013-06-21 LAB — HEPATIC FUNCTION PANEL
ALT: 19 U/L (ref 0–53)
Albumin: 4.3 g/dL (ref 3.5–5.2)
Alkaline Phosphatase: 51 U/L (ref 39–117)
Total Bilirubin: 0.8 mg/dL (ref 0.3–1.2)
Total Protein: 7.1 g/dL (ref 6.0–8.3)

## 2013-06-21 LAB — BASIC METABOLIC PANEL
BUN: 22 mg/dL (ref 6–23)
Chloride: 106 mEq/L (ref 96–112)
Creatinine, Ser: 1.1 mg/dL (ref 0.4–1.5)
Glucose, Bld: 97 mg/dL (ref 70–99)
Potassium: 4.2 mEq/L (ref 3.5–5.1)
Sodium: 141 mEq/L (ref 135–145)

## 2013-06-21 LAB — LIPID PANEL
Cholesterol: 179 mg/dL (ref 0–200)
LDL Cholesterol: 114 mg/dL — ABNORMAL HIGH (ref 0–99)
Triglycerides: 90 mg/dL (ref 0.0–149.0)

## 2013-06-21 LAB — PSA: PSA: 0.5 ng/mL (ref 0.10–4.00)

## 2013-06-21 LAB — TSH: TSH: 0.93 u[IU]/mL (ref 0.35–5.50)

## 2013-10-03 ENCOUNTER — Encounter: Payer: Self-pay | Admitting: Internal Medicine

## 2013-10-03 ENCOUNTER — Ambulatory Visit (INDEPENDENT_AMBULATORY_CARE_PROVIDER_SITE_OTHER): Payer: BC Managed Care – PPO | Admitting: Internal Medicine

## 2013-10-03 VITALS — BP 110/80 | HR 73 | Temp 99.5°F | Ht 72.0 in | Wt 217.2 lb

## 2013-10-03 DIAGNOSIS — M25561 Pain in right knee: Secondary | ICD-10-CM | POA: Insufficient documentation

## 2013-10-03 DIAGNOSIS — M25569 Pain in unspecified knee: Secondary | ICD-10-CM

## 2013-10-03 DIAGNOSIS — I10 Essential (primary) hypertension: Secondary | ICD-10-CM

## 2013-10-03 MED ORDER — HYDROCODONE-ACETAMINOPHEN 5-325 MG PO TABS: ORAL_TABLET | ORAL | Status: DC

## 2013-10-03 NOTE — Progress Notes (Signed)
Subjective:    Patient ID: Jimmy Hess, male    DOB: 03-19-1966, 48 y.o.   MRN: [id number]  HPI  Here with acute onset right medial knee pain and swelling, with some grinding and crunching, catch/? Locking since yesterday, pain now 8/10, started with mult squatting at work activity; has hx of similar left knee pain required meniscus tear repair; he is very reluctant for any ortho referral, limps to walk, wants to consider more conservative tx first. No fever, falls or other trauma, Past Medical History  Diagnosis Date  . HYPERTENSION 05/16/2007    Qualifier: Diagnosis of  By: Jonny Ruiz MD, Len Blalock   . HYPERLIPIDEMIA 09/10/2008    Qualifier: Diagnosis of  By: Jonny Ruiz MD, Len Blalock   . GERD 05/16/2007    Qualifier: Diagnosis of  By: Jonny Ruiz MD, Len Blalock   . ALLERGIC RHINITIS 05/16/2007    Qualifier: Diagnosis of  By: Jonny Ruiz MD, Len Blalock   . GLUCOSE-6-PHOSPHATE DEHYDROGENASE DEFICIENCY 05/12/2007    Qualifier: Diagnosis of  By: Maris Berger   . ERECTILE DYSFUNCTION 05/12/2007    Qualifier: Diagnosis of  By: Jonny Ruiz MD, Len Blalock    Past Surgical History  Procedure Laterality Date  . Nasal septum surgery      reports that he has never smoked. He does not have any smokeless tobacco history on file. He reports that he drinks alcohol. He reports that he does not use illicit drugs. family history includes Cancer in his father; Heart failure in his maternal grandmother; Hypertension in his maternal grandmother and mother. Allergies  Allergen Reactions  . Aspirin   . Sulfonamide Derivatives    No current outpatient prescriptions on file prior to visit.   No current facility-administered medications on file prior to visit.   Review of Systems  Constitutional: Negative for unexpected weight change, or unusual diaphoresis  HENT: Negative for tinnitus.   Eyes: Negative for photophobia and visual disturbance.  Respiratory: Negative for choking and stridor.   Gastrointestinal: Negative for vomiting and  blood in stool.  Genitourinary: Negative for hematuria and decreased urine volume.  Musculoskeletal: Negative for acute joint swelling Skin: Negative for color change and wound.  Neurological: Negative for tremors and numbness other than noted  Psychiatric/Behavioral: Negative for decreased concentration or  hyperactivity.       Objective:   Physical Exam BP 110/80  Pulse 73  Temp(Src) 99.5 F (37.5 C) (Oral)  Ht 6' (1.829 m)  Wt 217 lb 4 oz (98.544 kg)  BMI 29.46 kg/m2  SpO2 97% VS noted,  Constitutional: Pt appears well-developed and well-nourished.  HENT: Head: NCAT.  Right Ear: External ear normal.  Left Ear: External ear normal.  Eyes: Conjunctivae and EOM are normal. Pupils are equal, round, and reactive to light.  Neck: Normal range of motion. Neck supple.  Cardiovascular: Normal rate and regular rhythm.   Pulmonary/Chest: Effort normal and breath sounds normal.  Right knee with nondiscrete tender/swelling 1-2 + medial aspect only, neg lachmans, FROM, NT otherwise Neurological: Pt is alert. Not confused  Skin: Skin is warm. No erythema.  Psychiatric: Pt behavior is normal. Thought content normal.     Assessment & Plan:

## 2013-10-03 NOTE — Progress Notes (Signed)
Pre-visit discussion using our clinic review tool. No additional management support is needed unless otherwise documented below in the visit note.  

## 2013-10-03 NOTE — Patient Instructions (Signed)
Please take all new medication as prescribed Please continue all other medications as before, and refills have been done if requested.  You are given the work note today  You had an appt at 0845 tomorrow morning with Dr Katrinka BlazingSmith, but this can be changed at the scheduling desk as you leave today

## 2013-10-03 NOTE — Assessment & Plan Note (Addendum)
Diff includes meniscal tear, medial collat ligament strain or even bursitits; for pain control, crutches, no squatting or longer standing at work, for sport med referral in am; to consider MRI and/or ortho referral but declines at this time

## 2013-10-03 NOTE — Assessment & Plan Note (Signed)
stable overall by history and exam, recent data reviewed with pt, and pt to continue medical treatment as before,  to f/u any worsening symptoms or concerns BP Readings from Last 3 Encounters:  10/03/13 110/80  06/20/13 114/84  07/02/12 122/80

## 2013-10-04 ENCOUNTER — Ambulatory Visit (INDEPENDENT_AMBULATORY_CARE_PROVIDER_SITE_OTHER): Payer: BC Managed Care – PPO | Admitting: Family Medicine

## 2013-10-04 ENCOUNTER — Other Ambulatory Visit: Payer: BC Managed Care – PPO

## 2013-10-04 ENCOUNTER — Encounter: Payer: Self-pay | Admitting: *Deleted

## 2013-10-04 ENCOUNTER — Telehealth: Payer: Self-pay | Admitting: Internal Medicine

## 2013-10-04 ENCOUNTER — Other Ambulatory Visit (INDEPENDENT_AMBULATORY_CARE_PROVIDER_SITE_OTHER): Payer: BC Managed Care – PPO

## 2013-10-04 ENCOUNTER — Encounter: Payer: Self-pay | Admitting: Family Medicine

## 2013-10-04 VITALS — BP 128/72 | HR 73 | Temp 98.3°F | Resp 18 | Wt 219.4 lb

## 2013-10-04 DIAGNOSIS — M25561 Pain in right knee: Secondary | ICD-10-CM

## 2013-10-04 DIAGNOSIS — S99929A Unspecified injury of unspecified foot, initial encounter: Secondary | ICD-10-CM

## 2013-10-04 DIAGNOSIS — M25569 Pain in unspecified knee: Secondary | ICD-10-CM

## 2013-10-04 DIAGNOSIS — S8990XA Unspecified injury of unspecified lower leg, initial encounter: Secondary | ICD-10-CM

## 2013-10-04 DIAGNOSIS — S838X1A Sprain of other specified parts of right knee, initial encounter: Secondary | ICD-10-CM | POA: Insufficient documentation

## 2013-10-04 DIAGNOSIS — S99919A Unspecified injury of unspecified ankle, initial encounter: Secondary | ICD-10-CM

## 2013-10-04 MED ORDER — MELOXICAM 15 MG PO TABS: 15.0000 mg | ORAL_TABLET | Freq: Every day | ORAL | Status: DC

## 2013-10-04 NOTE — Assessment & Plan Note (Signed)
Patient does have what appears to be a tear of the medial meniscus with displacement. Patient does have significant hypoechoic changes in the area and an MCL tear. Likely it is not complete. Patient was given injection today with good relief of the pressure and was given a brace that was fitted by me today. 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 Meloxicam daily for 10 days and icing protocol. Patient will come back again in 2 weeks after being on light duty at work for further evaluation. Patient noted this can take up to 6-8 weeks to resolve completely.

## 2013-10-04 NOTE — Progress Notes (Signed)
Pre-visit discussion using our clinic review tool. No additional management support is needed unless otherwise documented below in the visit note.  

## 2013-10-04 NOTE — Progress Notes (Signed)
I'm seeing this patient by the request  of:  Oliver BarreJames John, MD   CC: Right knee pain  HPI: Patient is a very pleasant 48 year old woman coming in with right knee pain and swelling. Patient states it started 2 days ago. Does not remember any true injury. Patient states that he feels that there is grinding sensation it seems to catch him from time to time. Patient states that the pain is 9/10 in severity and seems to be worsening. When looking back on his history he states that maybe it started when he was doing a lot of squatting. Patient had a similar presentation of his left knee at one point and was meniscal tear and he would like to avoid surgery if possible. Patient denies any radiation down the leg, any numbness, or any weakness of the lower extremity.  Past medical, surgical, family and social history reviewed. Medications reviewed all in the electronic medical record.   Review of Systems: No headache, visual changes, nausea, vomiting, diarrhea, constipation, dizziness, abdominal pain, skin rash, fevers, chills, night sweats, weight loss, swollen lymph nodes, body aches, joint swelling, muscle aches, chest pain, shortness of breath, mood changes.   Objective:    Blood pressure 128/72, pulse 73, temperature 98.3 F (36.8 C), temperature source Oral, resp. rate 18, weight 219 lb 6.4 oz (99.519 kg), SpO2 96.00%.   General: No apparent distress alert and oriented x3 mood and affect normal, dressed appropriately.  HEENT: Pupils equal, extraocular movements intact Respiratory: Patient's speak in full sentences and does not appear short of breath Cardiovascular: No lower extremity edema, non tender, no erythema Skin: Warm dry intact with no signs of infection or rash on extremities or on axial skeleton. Abdomen: Soft nontender Neuro: Cranial nerves II through XII are intact, neurovascularly intact in all extremities with 2+ DTRs and 2+ pulses. Lymph: No lymphadenopathy of posterior or anterior  cervical chain or axillae bilaterally.  Gait gait abnormal MSK: Non tender with full range of motion and good stability and symmetric strength and tone of shoulders, elbows, wrist, hip,  and ankles bilaterally.   Knee: right Normal to inspection with no erythema trace effusion.  Palpation normal with no warmth, patellar tenderness, or condyle tenderness. Severe tenderness over the medial joint line ROM full in flexion and extension and lower leg rotation. Ligaments with solid consistent endpoints including ACL, PCL, LCL, MCL but pain with MCL test.  Positive Mcmurray's, Apley's, and Thessalonian tests. Non painful patellar compression. Patellar glide without crepitus. Patellar and quadriceps tendons unremarkable. Hamstring and quadriceps strength is normal.   MSK US performed of: Right This study was ordered, performed, and interpreted by Terrilee FilesZach Johnattan Strassman D.O.  Knee: All structures visualized. Anterior medial meniscus show significant displacement and significant hypoechoic changes anterolateral, posteromedial, and posterolateral menisci unremarkable without tearing, fraying, effusion, or displacement. Patellar Tendon unremarkable on long and transverse views without effusion. No abnormality of prepatellar bursa. LCL unremarkable on long and transverse views. MCl shows tear in distal ligament.  No abnormality of origin of medial or lateral head of the gastrocnemius.  IMPRESSION:  Medial meniscal tear with displacement and partial tear of MCL.  After informed written and verbal consent, patient was seated on exam table. Right knee was prepped with alcohol swab and utilizing anterolateral approach, patient's right knee space was injected with 4:1  marcaine 0.5%: Kenalog 40mg /dL. Patient tolerated the procedure well without immediate complications.   Impression and Recommendations:     This case required medical decision making of moderate complexity.

## 2013-10-04 NOTE — Telephone Encounter (Signed)
Relevant patient education assigned to patient using Emmi. ° °

## 2013-10-04 NOTE — Patient Instructions (Signed)
Good to meet you Meloxicam daily for 10 days Ice 20 minutes 2 times a day  Exercises most days of the week Light duty at work next 2 weeks. No running, avoid twisting as well Come back in 2 weeks.

## 2013-10-17 ENCOUNTER — Other Ambulatory Visit (INDEPENDENT_AMBULATORY_CARE_PROVIDER_SITE_OTHER): Payer: BC Managed Care – PPO

## 2013-10-17 ENCOUNTER — Ambulatory Visit (INDEPENDENT_AMBULATORY_CARE_PROVIDER_SITE_OTHER): Payer: BC Managed Care – PPO | Admitting: Family Medicine

## 2013-10-17 ENCOUNTER — Ambulatory Visit (INDEPENDENT_AMBULATORY_CARE_PROVIDER_SITE_OTHER)
Admission: RE | Admit: 2013-10-17 | Discharge: 2013-10-17 | Disposition: A | Discharge status: Home / Self Care | Payer: BC Managed Care – PPO | Source: Ambulatory Visit | Attending: Family Medicine | Admitting: Family Medicine

## 2013-10-17 ENCOUNTER — Encounter: Payer: Self-pay | Admitting: Family Medicine

## 2013-10-17 VITALS — BP 128/72 | HR 87 | Temp 99.4°F | Resp 16 | Wt 215.0 lb

## 2013-10-17 DIAGNOSIS — S99929A Unspecified injury of unspecified foot, initial encounter: Secondary | ICD-10-CM

## 2013-10-17 DIAGNOSIS — S99919A Unspecified injury of unspecified ankle, initial encounter: Secondary | ICD-10-CM

## 2013-10-17 DIAGNOSIS — S8990XA Unspecified injury of unspecified lower leg, initial encounter: Secondary | ICD-10-CM

## 2013-10-17 DIAGNOSIS — M25569 Pain in unspecified knee: Secondary | ICD-10-CM

## 2013-10-17 DIAGNOSIS — S838X1A Sprain of other specified parts of right knee, initial encounter: Secondary | ICD-10-CM

## 2013-10-17 DIAGNOSIS — M25561 Pain in right knee: Secondary | ICD-10-CM

## 2013-10-17 NOTE — Assessment & Plan Note (Signed)
I am encouraged by the amount of healing to do see on ultrasound today. Also patient subjectively states he is approximately 75% better. Even though there is significant displacement of the medial meniscus which means the tear likely does fairly deep into the knee I would like to continue with conservative therapy. We will get an x-ray to make sure there is no other bony defect. Patient will continue to wear the brace and do exercises a regular basis. Patient will come back to on a four-week followup in of her continuing to have pain we'll consider further imaging including MRI.

## 2013-10-17 NOTE — Progress Notes (Signed)
Pre-visit discussion using our clinic review tool. No additional management support is needed unless otherwise documented below in the visit note.  

## 2013-10-17 NOTE — Progress Notes (Signed)
  CC: Right knee pain follow up  HPI: Patient is a very pleasant 48 year old gentleman coming in with right knee pain and swelling previously. He was diagnosed with a partial MCL tear as well as a medial meniscal tear that did have some displacement on ultrasound. Patient states that overall he is improving somewhat. Patient states that he is approximately 75% better. Still has significant pain if he tries to squat down all the way. Patient is wearing the brace. Not doing the exercises on a rare basis. Patient of course would like to be better.  Past medical, surgical, family and social history reviewed. Medications reviewed all in the electronic medical record.   Review of Systems: No headache, visual changes, nausea, vomiting, diarrhea, constipation, dizziness, abdominal pain, skin rash, fevers, chills, night sweats, weight loss, swollen lymph nodes, body aches, joint swelling, muscle aches, chest pain, shortness of breath, mood changes.   Objective:    Blood pressure 128/72, pulse 87, temperature 99.4 F (37.4 C), temperature source Oral, resp. rate 16, weight 215 lb 0.6 oz (97.542 kg), SpO2 98.00%.   General: No apparent distress alert and oriented x3 mood and affect normal, dressed appropriately.  HEENT: Pupils equal, extraocular movements intact Respiratory: Patient's speak in full sentences and does not appear short of breath Cardiovascular: No lower extremity edema, non tender, no erythema Skin: Warm dry intact with no signs of infection or rash on extremities or on axial skeleton. Abdomen: Soft nontender Neuro: Cranial nerves II through XII are intact, neurovascularly intact in all extremities with 2+ DTRs and 2+ pulses. Lymph: No lymphadenopathy of posterior or anterior cervical chain or axillae bilaterally.  Gait gait abnormal MSK: Non tender with full range of motion and good stability and symmetric strength and tone of shoulders, elbows, wrist, hip,  and ankles bilaterally.    Knee: right Normal to inspection with no erythema but still trace effusion.  Palpation normal with no warmth, patellar tenderness, or condyle tenderness. Tenderness over the medial joint line ROM full in flexion and extension and lower leg rotation. Ligaments with solid consistent endpoints including ACL, PCL, LCL, MCL but pain with MCL test.  Positive Mcmurray's, Apley's, and Thessalonian tests. Non painful patellar compression. Patellar glide without crepitus. Patellar and quadriceps tendons unremarkable. Hamstring and quadriceps strength is normal.   MSK US performed of: Right This study was ordered, performed, and interpreted by Terrilee FilesZach Stephon Weathers D.O.  Knee: All structures visualized. Anterior medial meniscus show significant displacement but less hypoechoic changes  anterolateral, posteromedial, and posterolateral menisci unremarkable without tearing, fraying, effusion, or displacement. Patellar Tendon unremarkable on long and transverse views without effusion. No abnormality of prepatellar bursa. LCL unremarkable on long and transverse views. MCl shows tear in distal ligament but scar tissue present.   No abnormality of origin of medial or lateral head of the gastrocnemius.  IMPRESSION:  Improving of both medial meniscal tear and MCL tear.     Impression and Recommendations:     This case required medical decision making of moderate complexity.  Spent greater than 25 minutes with patient face-to-face and had greater than 50% of counseling including as described above in assessment and plan.

## 2013-10-17 NOTE — Patient Instructions (Signed)
Good to see you  Continue exercises Get back in the gym but decrease weight to 50% of what you usually do and increase by 10%  A week.  No squatting greater then 60 degrees. No running.  Ice after activity.  Get xrays downstairs.  Come back again in 3-4 weeks.

## 2013-11-13 ENCOUNTER — Other Ambulatory Visit (INDEPENDENT_AMBULATORY_CARE_PROVIDER_SITE_OTHER): Payer: BC Managed Care – PPO

## 2013-11-13 ENCOUNTER — Ambulatory Visit (INDEPENDENT_AMBULATORY_CARE_PROVIDER_SITE_OTHER): Payer: BC Managed Care – PPO | Admitting: Family Medicine

## 2013-11-13 ENCOUNTER — Encounter: Payer: Self-pay | Admitting: Family Medicine

## 2013-11-13 VITALS — BP 132/80 | HR 73 | Temp 99.3°F | Resp 18 | Wt 214.1 lb

## 2013-11-13 DIAGNOSIS — M25561 Pain in right knee: Secondary | ICD-10-CM

## 2013-11-13 DIAGNOSIS — S838X1A Sprain of other specified parts of right knee, initial encounter: Secondary | ICD-10-CM

## 2013-11-13 DIAGNOSIS — S99919A Unspecified injury of unspecified ankle, initial encounter: Secondary | ICD-10-CM

## 2013-11-13 DIAGNOSIS — M25569 Pain in unspecified knee: Secondary | ICD-10-CM

## 2013-11-13 DIAGNOSIS — S99929A Unspecified injury of unspecified foot, initial encounter: Secondary | ICD-10-CM

## 2013-11-13 DIAGNOSIS — S8990XA Unspecified injury of unspecified lower leg, initial encounter: Secondary | ICD-10-CM

## 2013-11-13 MED ORDER — CYCLOBENZAPRINE HCL 10 MG PO TABS
10.0000 mg | ORAL_TABLET | Freq: Three times a day (TID) | ORAL | Status: DC | PRN
Start: 2013-11-13 — End: 2015-04-15

## 2013-11-13 NOTE — Assessment & Plan Note (Signed)
Patient does still have some displacement. I do not see any significant hypoechoic changes but this makes me a little concerned that patient does have a very large meniscal tear going into the medial aspect of the knee. Patient will continue his current therapy. We will advance his exercises to see if he can tolerate. Patient continues to have pain then I will consider doing another injection for further imaging including an MRI. Patient will follow up again in 4 weeks.

## 2013-11-13 NOTE — Patient Instructions (Signed)
rGood to see you Start squatting up to 80 degrees.  Still wear brace with exercise Ice at end of long day.  For your neck try flexeril and other exercises  Come back in 4 weeks

## 2013-11-13 NOTE — Progress Notes (Signed)
  CC: Right knee pain follow up  HPI: Patient is a very pleasant 48 year old gentleman coming in with right knee pain and swelling previously. He was diagnosed with a partial MCL tear as well as a medial meniscal tear.  Patient states he is approximately 85% better. Patient states from time to time he does have some catching but overall is doing much better. Patient states he can do his daily activities and has been going to the gym and squatting 65. Patient has not noticed any significant discomfort or any worsening pain. Overall he thinks he continues to improve.  Past medical, surgical, family and social history reviewed. Medications reviewed all in the electronic medical record.   Review of Systems: No headache, visual changes, nausea, vomiting, diarrhea, constipation, dizziness, abdominal pain, skin rash, fevers, chills, night sweats, weight loss, swollen lymph nodes, body aches, joint swelling, muscle aches, chest pain, shortness of breath, mood changes.   Objective:    Blood pressure 132/80, pulse 73, temperature 99.3 F (37.4 C), temperature source Oral, resp. rate 18, weight 214 lb 1.3 oz (97.106 kg), SpO2 98.00%.   General: No apparent distress alert and oriented x3 mood and affect normal, dressed appropriately.  HEENT: Pupils equal, extraocular movements intact Respiratory: Patient's speak in full sentences and does not appear short of breath Cardiovascular: No lower extremity edema, non tender, no erythema Skin: Warm dry intact with no signs of infection or rash on extremities or on axial skeleton. Abdomen: Soft nontender Neuro: Cranial nerves II through XII are intact, neurovascularly intact in all extremities with 2+ DTRs and 2+ pulses. Lymph: No lymphadenopathy of posterior or anterior cervical chain or axillae bilaterally.  Gait gait abnormal MSK: Non tender with full range of motion and good stability and symmetric strength and tone of shoulders, elbows, wrist, hip,  and  ankles bilaterally.   Knee: right Normal to inspection with no erythema but still trace effusion.  Palpation normal with no warmth, patellar tenderness, or condyle tenderness. Tenderness over the medial joint line ROM full in flexion and extension and lower leg rotation. Ligaments with solid consistent endpoints including ACL, PCL, LCL, MCL but pain with MCL test.  Positive Mcmurray's, Apley's, and Thessalonian tests. Non painful patellar compression. Patellar glide without crepitus. Patellar and quadriceps tendons unremarkable. Hamstring and quadriceps strength is normal.   MSK US performed of: Right This study was ordered, performed, and interpreted by Terrilee FilesZach Smith D.O.  Knee: All structures visualized. Anterior medial meniscus show significant displacement but less hypoechoic changes, but there is still present  anterolateral, posteromedial, and posterolateral menisci unremarkable without tearing, fraying, effusion, or displacement. Patellar Tendon unremarkable on long and transverse views without effusion. No abnormality of prepatellar bursa. LCL unremarkable on long and transverse views. MCl shows tear in distal ligament but scar tissue present and completely healed.  No abnormality of origin of medial or lateral head of the gastrocnemius.  IMPRESSION: Healed MCL tear but continued displacement of medial meniscal tear     Impression and Recommendations:     This case required medical decision making of moderate complexity.  Spent greater than 25 minutes with patient face-to-face and had greater than 50% of counseling including as described above in assessment and plan.

## 2013-11-13 NOTE — Progress Notes (Signed)
Pre visit review using our clinic review tool, if applicable. No additional management support is needed unless otherwise documented below in the visit note. 

## 2013-12-13 ENCOUNTER — Ambulatory Visit: Payer: BC Managed Care – PPO | Admitting: Internal Medicine

## 2013-12-13 DIAGNOSIS — Z0289 Encounter for other administrative examinations: Secondary | ICD-10-CM

## 2014-09-09 ENCOUNTER — Ambulatory Visit (INDEPENDENT_AMBULATORY_CARE_PROVIDER_SITE_OTHER): Payer: 59 | Admitting: Physician Assistant

## 2014-09-09 VITALS — BP 138/88 | HR 87 | Temp 99.7°F | Resp 18 | Ht 73.5 in | Wt 208.4 lb

## 2014-09-09 DIAGNOSIS — J019 Acute sinusitis, unspecified: Secondary | ICD-10-CM

## 2014-09-09 MED ORDER — IPRATROPIUM BROMIDE 0.03 % NA SOLN: 2.0000 | Freq: Two times a day (BID) | NASAL | Status: DC

## 2014-09-09 MED ORDER — CETIRIZINE HCL 10 MG PO TABS: 10.0000 mg | ORAL_TABLET | Freq: Every day | ORAL | Status: DC

## 2014-09-09 NOTE — Progress Notes (Signed)
Subjective:    Patient ID: Jimmy Hess, male    DOB: [birth year], 49 y.o.   MRN: [id number]  HPI Patient presents for 1 week of sinus pressure that is accompanied by watery right eye, nasal congestion, rhinorrhea, and mild HA. Denies fever although sweats sometimes, sore throat, ear pressure, or N/V. Has tried nasal saline and Claritin D without any relief. Has sick contacts at work. 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 Has many environmental allergies, but denies asthma. Is not a smoker. PMH positive for G6PD so can not take asa or sulfa medications.   Review of Systems  Constitutional: Positive for diaphoresis. Negative for fever, chills and fatigue.  HENT: Positive for congestion, rhinorrhea and sinus pressure. Negative for ear discharge, ear pain, postnasal drip, sneezing and sore throat.   Eyes: Positive for discharge (watery) and redness (right). Negative for photophobia, pain and itching.  Respiratory: Negative for cough, chest tightness, shortness of breath and wheezing.   Cardiovascular: Negative for chest pain.  Gastrointestinal: Negative for nausea and vomiting.  Allergic/Immunologic: Positive for environmental allergies. Negative for food allergies.  Neurological: Positive for headaches. Negative for dizziness.       Objective:   Physical Exam  Constitutional: He is oriented to person, place, and time. He appears well-developed. No distress.  Blood pressure 138/88, pulse 87, temperature 99.7 F (37.6 C), temperature source Oral, resp. rate 18, height 6' 1.5" (1.867 m), weight 208 lb 6.4 oz (94.53 kg), SpO2 99 %.  HENT:  Head: Normocephalic and atraumatic.  Right Ear: Tympanic membrane, external ear and ear canal normal.  Left Ear: Tympanic membrane, external ear and ear canal normal.  Nose: Mucosal edema (with erythema) and rhinorrhea present. No sinus tenderness. Right sinus exhibits no  maxillary sinus tenderness and no frontal sinus tenderness. Left sinus exhibits no maxillary sinus tenderness and no frontal sinus tenderness.  Mouth/Throat: Uvula is midline, oropharynx is clear and moist and mucous membranes are normal. No oropharyngeal exudate, posterior oropharyngeal edema or posterior oropharyngeal erythema.  Eyes: Pupils are equal, round, and reactive to light. Right eye exhibits discharge (watery). Left eye exhibits no discharge. No scleral icterus.  Neck: Neck supple. No thyromegaly present.  Cardiovascular: Normal rate, regular rhythm and normal heart sounds.  Exam reveals no gallop and no friction rub.   No murmur heard. Pulmonary/Chest: Effort normal and breath sounds normal. No respiratory distress. He has no wheezes. He has no rales.  Abdominal: Soft. Bowel sounds are normal. There is no tenderness.  Lymphadenopathy:    He has no cervical adenopathy.  Neurological: He is alert and oriented to person, place, and time.  Skin: Skin is warm and dry. No rash noted. He is not diaphoretic. No erythema. No pallor.       Assessment & Plan:  1. Acute sinusitis, recurrence not specified, unspecified location Declines mucinex. Suggest plenty of fluid. - ipratropium (ATROVENT) 0.03 % nasal spray; Place 2 sprays into both nostrils 2 (two) times daily.  Dispense: 30 mL; Refill: 0 - cetirizine (ZYRTEC) 10 MG tablet; Take 1 tablet (10 mg total) by mouth daily.  Dispense: 30 tablet; Refill: 11   Adrinne Sze PA-C  [facility] 09/09/2014 1:47 PM

## 2014-09-09 NOTE — Patient Instructions (Signed)

## 2014-09-26 ENCOUNTER — Telehealth: Payer: Self-pay | Admitting: Family Medicine

## 2014-09-26 DIAGNOSIS — J0110 Acute frontal sinusitis, unspecified: Secondary | ICD-10-CM

## 2014-09-26 MED ORDER — AMOXICILLIN 500 MG PO CAPS: 1000.0000 mg | ORAL_CAPSULE | Freq: Two times a day (BID) | ORAL | Status: DC

## 2014-09-26 NOTE — Telephone Encounter (Signed)
Received a call from the pt- he states he was in with a "sinus infection" on 1/5- he was treated with atrovent nasal and zyrtec.  He states his main sx are in his sinuses.  He has not checked his temperature but has felt hot and cold. States his sx are more in his sinuses and not "in his chest" at this time.   Advised him that I will call in some amox for him.  However also asked him to start checking his temp to see if he is really running a fever.  If so and if not better in the next couple of days please come in and see us.  We are closed today for weather

## 2015-04-15 ENCOUNTER — Encounter: Payer: Self-pay | Admitting: Internal Medicine

## 2015-04-15 ENCOUNTER — Other Ambulatory Visit (INDEPENDENT_AMBULATORY_CARE_PROVIDER_SITE_OTHER): Payer: 59

## 2015-04-15 ENCOUNTER — Ambulatory Visit (INDEPENDENT_AMBULATORY_CARE_PROVIDER_SITE_OTHER): Payer: 59 | Admitting: Internal Medicine

## 2015-04-15 VITALS — BP 142/90 | HR 78 | Temp 98.3°F | Ht 74.0 in | Wt 198.0 lb

## 2015-04-15 DIAGNOSIS — Z0000 Encounter for general adult medical examination without abnormal findings: Secondary | ICD-10-CM | POA: Diagnosis not present

## 2015-04-15 DIAGNOSIS — I10 Essential (primary) hypertension: Secondary | ICD-10-CM

## 2015-04-15 LAB — PSA: PSA: 0.59 ng/mL (ref 0.10–4.00)

## 2015-04-15 LAB — URINALYSIS, ROUTINE W REFLEX MICROSCOPIC
Bilirubin Urine: NEGATIVE
Hgb urine dipstick: NEGATIVE
KETONES UR: NEGATIVE
Leukocytes, UA: NEGATIVE
Nitrite: NEGATIVE
Specific Gravity, Urine: 1.015 (ref 1.000–1.030)
Total Protein, Urine: NEGATIVE
URINE GLUCOSE: NEGATIVE
UROBILINOGEN UA: 0.2 (ref 0.0–1.0)
pH: 6.5 (ref 5.0–8.0)

## 2015-04-15 LAB — CBC WITH DIFFERENTIAL/PLATELET
BASOS PCT: 0.5 % (ref 0.0–3.0)
Basophils Absolute: 0 10*3/uL (ref 0.0–0.1)
EOS ABS: 0.1 10*3/uL (ref 0.0–0.7)
Eosinophils Relative: 4.2 % (ref 0.0–5.0)
HCT: 41.3 % (ref 39.0–52.0)
HEMOGLOBIN: 14.3 g/dL (ref 13.0–17.0)
Lymphocytes Relative: 37.7 % (ref 12.0–46.0)
Lymphs Abs: 1.1 10*3/uL (ref 0.7–4.0)
MCHC: 34.5 g/dL (ref 30.0–36.0)
MCV: 94.4 fl (ref 78.0–100.0)
MONO ABS: 0.3 10*3/uL (ref 0.1–1.0)
Monocytes Relative: 11 % (ref 3.0–12.0)
NEUTROS ABS: 1.4 10*3/uL (ref 1.4–7.7)
Neutrophils Relative %: 46.6 % (ref 43.0–77.0)
Platelets: 148 10*3/uL — ABNORMAL LOW (ref 150.0–400.0)
RBC: 4.37 Mil/uL (ref 4.22–5.81)
RDW: 12.4 % (ref 11.5–15.5)
WBC: 3 10*3/uL — AB (ref 4.0–10.5)

## 2015-04-15 LAB — LIPID PANEL
Cholesterol: 168 mg/dL (ref 0–200)
HDL: 67.4 mg/dL (ref >39.00)
LDL Cholesterol: 90 mg/dL (ref 0–99)
NonHDL: 100.62
Total CHOL/HDL Ratio: 2
Triglycerides: 55 mg/dL (ref 0.0–149.0)
VLDL: 11 mg/dL (ref 0.0–40.0)

## 2015-04-15 LAB — HEPATIC FUNCTION PANEL
ALBUMIN: 4.4 g/dL (ref 3.5–5.2)
ALT: 17 U/L (ref 0–53)
AST: 19 U/L (ref 0–37)
Alkaline Phosphatase: 57 U/L (ref 39–117)
Bilirubin, Direct: 0.2 mg/dL (ref 0.0–0.3)
TOTAL PROTEIN: 7 g/dL (ref 6.0–8.3)
Total Bilirubin: 0.8 mg/dL (ref 0.2–1.2)

## 2015-04-15 LAB — BASIC METABOLIC PANEL
BUN: 11 mg/dL (ref 6–23)
CALCIUM: 9.4 mg/dL (ref 8.4–10.5)
CO2: 28 meq/L (ref 19–32)
Chloride: 105 mEq/L (ref 96–112)
Creatinine, Ser: 0.97 mg/dL (ref 0.40–1.50)
GFR: 105.62 mL/min (ref >60.00)
Glucose, Bld: 100 mg/dL — ABNORMAL HIGH (ref 70–99)
Potassium: 4.2 mEq/L (ref 3.5–5.1)
Sodium: 141 mEq/L (ref 135–145)

## 2015-04-15 LAB — TSH: TSH: 0.92 u[IU]/mL (ref 0.35–4.50)

## 2015-04-15 MED ORDER — ASPIRIN EC 81 MG PO TBEC: 81.0000 mg | DELAYED_RELEASE_TABLET | Freq: Every day | ORAL | Status: DC

## 2015-04-15 MED ORDER — TELMISARTAN 40 MG PO TABS: 40.0000 mg | ORAL_TABLET | Freq: Every day | ORAL | Status: DC

## 2015-04-15 NOTE — Patient Instructions (Signed)
Please take all new medication as prescribed - the micardis  Please also start Aspirin 81 mg per day (enteric COATED only)  Please continue all other medications as before, and refills have been done if requested.  Please have the pharmacy call with any other refills you may need.  Please continue your efforts at being more active, low cholesterol diet, and weight control.  You are otherwise up to date with prevention measures today.  Please keep your appointments with your specialists as you may have planned  Please go to the LAB in the Basement (turn left off the elevator) for the tests to be done today  You will be contacted by phone if any changes need to be made immediately.  Otherwise, you will receive a letter about your results with an explanation, but please check with MyChart first.  Please remember to sign up for MyChart if you have not done so, as this will be important to you in the future with finding out test results, communicating by private email, and scheduling acute appointments online when needed.  Please return in 6 months, or sooner if needed

## 2015-04-15 NOTE — Progress Notes (Signed)
Subjective:    Patient ID: Jimmy Hess, male    DOB: 17-Oct-1965, 49 y.o.   MRN: [id number]  HPI  Here for wellness and f/u;  Overall doing ok;  Pt denies Chest pain, worsening SOB, DOE, wheezing, orthopnea, PND, worsening LE edema, palpitations, dizziness or syncope.  Pt denies neurological change such as new headache, facial or extremity weakness.  Pt denies polydipsia, polyuria, or low sugar symptoms. Pt states overall good compliance with treatment and medications, good tolerability, and has been trying to follow appropriate diet.  Pt denies worsening depressive symptoms, suicidal ideation or panic. No fever, night sweats, wt loss, loss of appetite, or other constitutional symptoms.  Pt states good ability with ADL's, has low fall risk, home safety reviewed and adequate, no other significant changes in hearing or vision, and only occasionally active with exercise.  Has had several nosebleeds.  BP has been mildly elevated at pharmacy and at home. Past Medical History  Diagnosis Date  . HYPERTENSION 05/16/2007    Qualifier: Diagnosis of  By: Jonny Ruiz MD, Len Blalock   . HYPERLIPIDEMIA 09/10/2008    Qualifier: Diagnosis of  By: Jonny Ruiz MD, Len Blalock   . GERD 05/16/2007    Qualifier: Diagnosis of  By: Jonny Ruiz MD, Len Blalock   . ALLERGIC RHINITIS 05/16/2007    Qualifier: Diagnosis of  By: Jonny Ruiz MD, Len Blalock   . GLUCOSE-6-PHOSPHATE DEHYDROGENASE DEFICIENCY 05/12/2007    Qualifier: Diagnosis of  By: Maris Berger   . ERECTILE DYSFUNCTION 05/12/2007    Qualifier: Diagnosis of  By: Jonny Ruiz MD, Len Blalock   . Allergy   . Arthritis    Past Surgical History  Procedure Laterality Date  . Nasal septum surgery    . Fracture surgery      reports that he has never smoked. He has never used smokeless tobacco. He reports that he drinks alcohol. He reports that he does not use illicit drugs. family history includes Cancer in his father; Heart failure in his maternal grandmother; Hypertension in his maternal grandmother and  mother. Allergies  Allergen Reactions  . Aspirin   . Sulfonamide Derivatives    Current Outpatient Prescriptions on File Prior to Visit  Medication Sig Dispense Refill  . cetirizine (ZYRTEC) 10 MG tablet Take 1 tablet (10 mg total) by mouth daily. 30 tablet 11  . amoxicillin (AMOXIL) 500 MG capsule Take 2 capsules (1,000 mg total) by mouth 2 (two) times daily. (Patient not taking: Reported on 04/15/2015) 40 capsule 0  . cyclobenzaprine (FLEXERIL) 10 MG tablet Take 1 tablet (10 mg total) by mouth 3 (three) times daily as needed for muscle spasms. (Patient not taking: Reported on 09/09/2014) 30 tablet 0  . HYDROcodone-acetaminophen (NORCO/VICODIN) 5-325 MG per tablet 1-2 tabs by mouth every 6 hrs as needed for pain (Patient not taking: Reported on 09/09/2014) 60 tablet 0  . ipratropium (ATROVENT) 0.03 % nasal spray Place 2 sprays into both nostrils 2 (two) times daily. (Patient not taking: Reported on 04/15/2015) 30 mL 0   No current facility-administered medications on file prior to visit.   Review of Systems Constitutional: Negative for increased diaphoresis, other activity, appetite or siginficant weight change other than noted HENT: Negative for worsening hearing loss, ear pain, facial swelling, mouth sores and neck stiffness.   Eyes: Negative for other worsening pain, redness or visual disturbance.  Respiratory: Negative for shortness of breath and wheezing  Cardiovascular: Negative for chest pain and palpitations.  Gastrointestinal: Negative for diarrhea, blood in stool,  abdominal distention or other pain Genitourinary: Negative for hematuria, flank pain or change in urine volume.  Musculoskeletal: Negative for myalgias or other joint complaints.  Skin: Negative for color change and wound or drainage.  Neurological: Negative for syncope and numbness. other than noted Hematological: Negative for adenopathy. or other swelling Psychiatric/Behavioral: Negative for hallucinations, SI,  self-injury, decreased concentration or other worsening agitation.      Objective:   Physical Exam BP 142/90 mmHg  Pulse 78  Temp(Src) 98.3 F (36.8 C) (Oral)  Ht 6\' 2"  (1.88 m)  Wt 198 lb (89.812 kg)  BMI 25.41 kg/m2  SpO2 97% VS noted,  Constitutional: Pt is oriented to person, place, and time. Appears well-developed and well-nourished, in no significant distress Head: Normocephalic and atraumatic.  Right Ear: External ear normal.  Left Ear: External ear normal.  Nose: Nose normal.  Mouth/Throat: Oropharynx is clear and moist.  Eyes: Conjunctivae and EOM are normal. Pupils are equal, round, and reactive to light.  Neck: Normal range of motion. Neck supple. No JVD present. No tracheal deviation present or significant neck LA or mass Cardiovascular: Normal rate, regular rhythm, normal heart sounds and intact distal pulses.   Pulmonary/Chest: Effort normal and breath sounds without rales or wheezing  Abdominal: Soft. Bowel sounds are normal. NT. No HSM  Musculoskeletal: Normal range of motion. Exhibits no edema.  Lymphadenopathy:  Has no cervical adenopathy.  Neurological: Pt is alert and oriented to person, place, and time. Pt has normal reflexes. No cranial nerve deficit. Motor grossly intact Skin: Skin is warm and dry. No rash noted.  Psychiatric:  Has normal mood and affect. Behavior is normal.     Assessment & Plan:

## 2015-04-15 NOTE — Progress Notes (Signed)
Pre visit review using our clinic review tool, if applicable. No additional management support is needed unless otherwise documented below in the visit note. 

## 2015-04-15 NOTE — Assessment & Plan Note (Signed)
Mild uncontrolled, has done well with micardis before, o/w stable overall by history and exam, recent data reviewed with pt, and pt to continue medical treatment as before,  to f/u any worsening symptoms or concerns,  BP Readings from Last 3 Encounters:  04/15/15 142/90  09/09/14 138/88  11/13/13 132/80   To retart lower dose micardis

## 2015-04-15 NOTE — Assessment & Plan Note (Signed)

## 2015-10-21 ENCOUNTER — Encounter: Payer: Self-pay | Admitting: Internal Medicine

## 2015-10-21 ENCOUNTER — Ambulatory Visit (INDEPENDENT_AMBULATORY_CARE_PROVIDER_SITE_OTHER): Payer: 59 | Admitting: Internal Medicine

## 2015-10-21 ENCOUNTER — Other Ambulatory Visit (INDEPENDENT_AMBULATORY_CARE_PROVIDER_SITE_OTHER): Payer: 59

## 2015-10-21 VITALS — BP 136/80 | HR 91 | Temp 98.9°F | Resp 20 | Wt 216.0 lb

## 2015-10-21 DIAGNOSIS — I10 Essential (primary) hypertension: Secondary | ICD-10-CM

## 2015-10-21 DIAGNOSIS — L731 Pseudofolliculitis barbae: Secondary | ICD-10-CM | POA: Insufficient documentation

## 2015-10-21 DIAGNOSIS — R040 Epistaxis: Secondary | ICD-10-CM

## 2015-10-21 DIAGNOSIS — Z0000 Encounter for general adult medical examination without abnormal findings: Secondary | ICD-10-CM

## 2015-10-21 DIAGNOSIS — E785 Hyperlipidemia, unspecified: Secondary | ICD-10-CM | POA: Diagnosis not present

## 2015-10-21 LAB — BASIC METABOLIC PANEL
BUN: 17 mg/dL (ref 6–23)
CHLORIDE: 105 meq/L (ref 96–112)
CO2: 29 mEq/L (ref 19–32)
Calcium: 9.6 mg/dL (ref 8.4–10.5)
Creatinine, Ser: 1.16 mg/dL (ref 0.40–1.50)
GFR: 85.74 mL/min (ref >60.00)
Glucose, Bld: 85 mg/dL (ref 70–99)
POTASSIUM: 4.7 meq/L (ref 3.5–5.1)
Sodium: 139 mEq/L (ref 135–145)

## 2015-10-21 LAB — TSH: TSH: 0.89 u[IU]/mL (ref 0.35–4.50)

## 2015-10-21 LAB — URINALYSIS, ROUTINE W REFLEX MICROSCOPIC
BILIRUBIN URINE: NEGATIVE
Hgb urine dipstick: NEGATIVE
KETONES UR: NEGATIVE
Leukocytes, UA: NEGATIVE
Nitrite: NEGATIVE
PH: 6 (ref 5.0–8.0)
RBC / HPF: NONE SEEN (ref 0–?)
SPECIFIC GRAVITY, URINE: 1.01 (ref 1.000–1.030)
Total Protein, Urine: NEGATIVE
UROBILINOGEN UA: 0.2 (ref 0.0–1.0)
Urine Glucose: NEGATIVE
WBC, UA: NONE SEEN (ref 0–?)

## 2015-10-21 LAB — CBC WITH DIFFERENTIAL/PLATELET
BASOS ABS: 0 10*3/uL (ref 0.0–0.1)
BASOS PCT: 0.4 % (ref 0.0–3.0)
Eosinophils Absolute: 0 10*3/uL (ref 0.0–0.7)
Eosinophils Relative: 1.3 % (ref 0.0–5.0)
HCT: 42.5 % (ref 39.0–52.0)
Hemoglobin: 14.4 g/dL (ref 13.0–17.0)
Lymphocytes Relative: 28 % (ref 12.0–46.0)
Lymphs Abs: 1 10*3/uL (ref 0.7–4.0)
MCHC: 34 g/dL (ref 30.0–36.0)
MCV: 93.2 fl (ref 78.0–100.0)
MONO ABS: 0.3 10*3/uL (ref 0.1–1.0)
Monocytes Relative: 9.9 % (ref 3.0–12.0)
NEUTROS ABS: 2.1 10*3/uL (ref 1.4–7.7)
Neutrophils Relative %: 60.4 % (ref 43.0–77.0)
PLATELETS: 161 10*3/uL (ref 150.0–400.0)
RBC: 4.57 Mil/uL (ref 4.22–5.81)
RDW: 12.2 % (ref 11.5–15.5)
WBC: 3.5 10*3/uL — AB (ref 4.0–10.5)

## 2015-10-21 LAB — LIPID PANEL
CHOL/HDL RATIO: 3
CHOLESTEROL: 176 mg/dL (ref 0–200)
HDL: 56.8 mg/dL (ref >39.00)
LDL Cholesterol: 110 mg/dL — ABNORMAL HIGH (ref 0–99)
NonHDL: 119.03
Triglycerides: 45 mg/dL (ref 0.0–149.0)
VLDL: 9 mg/dL (ref 0.0–40.0)

## 2015-10-21 LAB — HEPATIC FUNCTION PANEL
ALT: 13 U/L (ref 0–53)
AST: 20 U/L (ref 0–37)
Albumin: 4.6 g/dL (ref 3.5–5.2)
Alkaline Phosphatase: 55 U/L (ref 39–117)
BILIRUBIN TOTAL: 0.7 mg/dL (ref 0.2–1.2)
Bilirubin, Direct: 0.2 mg/dL (ref 0.0–0.3)
TOTAL PROTEIN: 7.2 g/dL (ref 6.0–8.3)

## 2015-10-21 LAB — PSA: PSA: 0.52 ng/mL (ref 0.10–4.00)

## 2015-10-21 NOTE — Assessment & Plan Note (Signed)
Right side recurrent, for ENT referral

## 2015-10-21 NOTE — Progress Notes (Signed)
Pre visit review using our clinic review tool, if applicable. No additional management support is needed unless otherwise documented below in the visit note. 

## 2015-10-21 NOTE — Assessment & Plan Note (Signed)
Ok for work note, to allow no shaving

## 2015-10-21 NOTE — Assessment & Plan Note (Signed)
stable overall by history and exam, recent data reviewed with pt, and pt to continue medical treatment as before,  to f/u any worsening symptoms or concerns BP Readings from Last 3 Encounters:  10/21/15 136/80  04/15/15 142/90  09/09/14 138/88

## 2015-10-21 NOTE — Patient Instructions (Addendum)
Please continue all other medications as before, and refills have been done if requested.  Please have the pharmacy call with any other refills you may need.  Please continue your efforts at being more active, low cholesterol diet, and weight control.  You are otherwise up to date with prevention measures today.  Please keep your appointments with your specialists as you may have planned  You will be contacted regarding the referral for: colonoscopy for after your birthday in march, and ENT referral  You are given the work note today  Please go to the LAB in the Basement (turn left off the elevator) for the tests to be done today  You will be contacted by phone if any changes need to be made immediately.  Otherwise, you will receive a letter about your results with an explanation, but please check with MyChart first.  Please remember to sign up for MyChart if you have not done so, as this will be important to you in the future with finding out test results, communicating by private email, and scheduling acute appointments online when needed.  Please return in 1 year for your yearly visit, or sooner if needed, with Lab testing done 3-5 days before

## 2015-10-21 NOTE — Assessment & Plan Note (Signed)
stable overall by history and exam, recent data reviewed with pt, and pt to continue medical treatment as before,  to f/u any worsening symptoms or concerns Lab Results  Component Value Date   LDLCALC 90 04/15/2015   Cont diet, f/u lab

## 2015-10-21 NOTE — Progress Notes (Signed)
Subjective:    Patient ID: Jimmy Hess, male    DOB: 1966/08/04, 50 y.o.   MRN: [id number]  HPI  Here for wellness and f/u;  Overall doing ok;  Pt denies Chest pain, worsening SOB, DOE, wheezing, orthopnea, PND, worsening LE edema, palpitations, dizziness or syncope.  Pt denies neurological change such as new headache, facial or extremity weakness.  Pt denies polydipsia, polyuria, or low sugar symptoms. Pt states overall good compliance with treatment and medications, good tolerability, and has been trying to follow appropriate diet.  Pt denies worsening depressive symptoms, suicidal ideation or panic. No fever, night sweats, wt loss, loss of appetite, or other constitutional symptoms.  Pt states good ability with ADL's, has low fall risk, home safety reviewed and adequate, no other significant changes in hearing or vision, and only occasionally active with exercise. Due for colonosocpy, declines flu shot.  Has several right side nosebleeds per month with irritation, and needs ENT referral.  Also asks for work note to allow non shaving at work due to recurring pseudofollicultiis barbae Past Medical History  Diagnosis Date  . HYPERTENSION 05/16/2007    Qualifier: Diagnosis of  By: Jonny Ruiz MD, Len Blalock   . HYPERLIPIDEMIA 09/10/2008    Qualifier: Diagnosis of  By: Jonny Ruiz MD, Len Blalock   . GERD 05/16/2007    Qualifier: Diagnosis of  By: Jonny Ruiz MD, Len Blalock   . ALLERGIC RHINITIS 05/16/2007    Qualifier: Diagnosis of  By: Jonny Ruiz MD, Len Blalock   . GLUCOSE-6-PHOSPHATE DEHYDROGENASE DEFICIENCY 05/12/2007    Qualifier: Diagnosis of  By: Maris Berger   . ERECTILE DYSFUNCTION 05/12/2007    Qualifier: Diagnosis of  By: Jonny Ruiz MD, Len Blalock   . Allergy   . Arthritis    Past Surgical History  Procedure Laterality Date  . Nasal septum surgery    . Fracture surgery      reports that he has never smoked. He has never used smokeless tobacco. He reports that he drinks alcohol. He reports that he does not use illicit  drugs. family history includes Cancer in his father; Heart failure in his maternal grandmother; Hypertension in his maternal grandmother and mother. Allergies  Allergen Reactions  . Aspirin   . Sulfonamide Derivatives    Current Outpatient Prescriptions on File Prior to Visit  Medication Sig Dispense Refill  . telmisartan (MICARDIS) 40 MG tablet Take 1 tablet (40 mg total) by mouth daily. 90 tablet 3   No current facility-administered medications on file prior to visit.    Review of Systems Constitutional: Negative for increased diaphoresis, other activity, appetite or siginficant weight change other than noted HENT: Negative for worsening hearing loss, ear pain, facial swelling, mouth sores and neck stiffness.   Eyes: Negative for other worsening pain, redness or visual disturbance.  Respiratory: Negative for shortness of breath and wheezing  Cardiovascular: Negative for chest pain and palpitations.  Gastrointestinal: Negative for diarrhea, blood in stool, abdominal distention or other pain Genitourinary: Negative for hematuria, flank pain or change in urine volume.  Musculoskeletal: Negative for myalgias or other joint complaints.  Skin: Negative for color change and wound or drainage.  Neurological: Negative for syncope and numbness. other than noted Hematological: Negative for adenopathy. or other swelling Psychiatric/Behavioral: Negative for hallucinations, SI, self-injury, decreased concentration or other worsening agitation.      Objective:   Physical Exam BP 136/80 mmHg  Pulse 91  Temp(Src) 98.9 F (37.2 C) (Oral)  Resp 20  Wt 216 lb (  97.977 kg)  SpO2 96% VS noted,  Constitutional: Pt is oriented to person, place, and time. Appears well-developed and well-nourished, in no significant distress Head: Normocephalic and atraumatic.  Right Ear: External ear normal.  Left Ear: External ear normal.  Nose: Nose normal.  Mouth/Throat: Oropharynx is clear and moist.  Eyes:  Conjunctivae and EOM are normal. Pupils are equal, round, and reactive to light.  Neck: Normal range of motion. Neck supple. No JVD present. No tracheal deviation present or significant neck LA or mass Cardiovascular: Normal rate, regular rhythm, normal heart sounds and intact distal pulses.   Pulmonary/Chest: Effort normal and breath sounds without rales or wheezing  Abdominal: Soft. Bowel sounds are normal. NT. No HSM  Musculoskeletal: Normal range of motion. Exhibits no edema.  Lymphadenopathy:  Has no cervical adenopathy.  Neurological: Pt is alert and oriented to person, place, and time. Pt has normal reflexes. No cranial nerve deficit. Motor grossly intact Skin: Skin is warm and dry. No rash noted. has some evidence for facial ingrown hair Psychiatric:  Has normal mood and affect. Behavior is normal.     Assessment & Plan:

## 2015-10-21 NOTE — Assessment & Plan Note (Signed)

## 2015-11-18 ENCOUNTER — Encounter: Payer: Self-pay | Admitting: Gastroenterology

## 2015-12-07 DIAGNOSIS — J342 Deviated nasal septum: Secondary | ICD-10-CM | POA: Insufficient documentation

## 2015-12-07 DIAGNOSIS — J343 Hypertrophy of nasal turbinates: Secondary | ICD-10-CM | POA: Insufficient documentation

## 2016-01-11 ENCOUNTER — Encounter: Payer: 59 | Admitting: Gastroenterology

## 2016-01-17 ENCOUNTER — Encounter (HOSPITAL_COMMUNITY): Payer: Self-pay | Admitting: *Deleted

## 2016-01-17 ENCOUNTER — Ambulatory Visit (INDEPENDENT_AMBULATORY_CARE_PROVIDER_SITE_OTHER): Payer: 59

## 2016-01-17 ENCOUNTER — Ambulatory Visit (HOSPITAL_COMMUNITY)
Admission: EM | Admit: 2016-01-17 | Discharge: 2016-01-17 | Disposition: A | Discharge status: Home / Self Care | Payer: 59 | Attending: Emergency Medicine | Admitting: Emergency Medicine

## 2016-01-17 DIAGNOSIS — J189 Pneumonia, unspecified organism: Secondary | ICD-10-CM | POA: Diagnosis not present

## 2016-01-17 MED ORDER — PREDNISONE 50 MG PO TABS
ORAL_TABLET | ORAL | Status: DC
Start: 2016-01-17 — End: 2016-05-31

## 2016-01-17 MED ORDER — AZITHROMYCIN 250 MG PO TABS: ORAL_TABLET | ORAL | Status: DC

## 2016-01-17 NOTE — ED Provider Notes (Signed)
CSN: [id number]     Arrival date & time 01/17/16  1301 History   First MD Initiated Contact with Patient 01/17/16 1308     Chief Complaint  Patient presents with  . Nasal Congestion  . Cough   (Consider location/radiation/quality/duration/timing/severity/associated sxs/prior Treatment) HPI  He is a 50 year old man here for evaluation of cough. He states his symptoms started about a week ago with nasal congestion and rhinorrhea. He started taking Claritin-D, decongestants, and saline spray with minimal improvement. 2-3 days ago, he developed a cough. He also reports feeling congestion in his chest. The cough is intermittently productive. He denies any chest pain or shortness of breath. He reports subjective fevers and sweats for the last week. His appetite is slightly decreased, but denies any nausea or vomiting.  Past Medical History  Diagnosis Date  . HYPERLIPIDEMIA 09/10/2008    Qualifier: Diagnosis of  By: Jonny RuizJohn MD, Len BlalockJames W   . GERD 05/16/2007    Qualifier: Diagnosis of  By: Jonny RuizJohn MD, Len BlalockJames W   . ALLERGIC RHINITIS 05/16/2007    Qualifier: Diagnosis of  By: Jonny RuizJohn MD, Len BlalockJames W   . GLUCOSE-6-PHOSPHATE DEHYDROGENASE DEFICIENCY 05/12/2007    Qualifier: Diagnosis of  By: Maris BergerSherwood, Elizabeth Ann   . ERECTILE DYSFUNCTION 05/12/2007    Qualifier: Diagnosis of  By: Jonny RuizJohn MD, Len BlalockJames W   . Allergy   . Arthritis   . HYPERTENSION 05/16/2007    Qualifier: Diagnosis of  By: Jonny RuizJohn MD, Len BlalockJames W; HTN meds discontinued by provider   Past Surgical History  Procedure Laterality Date  . Nasal septum surgery    . Fracture surgery    . Knee surgery     Family History  Problem Relation Age of Onset  . Hypertension Mother   . Heart failure Maternal Grandmother   . Hypertension Maternal Grandmother   . Cancer Father     prostate   Social History  Substance Use Topics  . Smoking status: Former Games developermoker  . Smokeless tobacco: Never Used  . Alcohol Use: Yes     Comment: occasionally    Review of Systems As in  history of present illness Allergies  Aspirin and Sulfonamide derivatives  Home Medications   Prior to Admission medications   Medication Sig Start Date End Date Taking? Authorizing Provider  azithromycin (ZITHROMAX Z-PAK) 250 MG tablet Take 2 pills today, then 1 pill daily until gone. 01/17/16   Charm RingsErin J Javeion Cannedy, MD  predniSONE (DELTASONE) 50 MG tablet Take 1 pill daily for 5 days. 01/17/16   Charm RingsErin J Charmine Bockrath, MD   Meds Ordered and Administered this Visit  Medications - No data to display  BP 146/83 mmHg  Pulse 83  Temp(Src) 99 F (37.2 C) (Oral)  Resp 18  SpO2 97% No data found.   Physical Exam  Constitutional: He is oriented to person, place, and time. He appears well-developed and well-nourished. No distress.  HENT:  Mouth/Throat: No oropharyngeal exudate.  Minor oropharyngeal erythema. Nasal mucosa is edematous and erythematous. TM slightly retracted bilaterally.  Neck: Neck supple.  Cardiovascular: Normal rate, regular rhythm and normal heart sounds.   No murmur heard. Pulmonary/Chest: Effort normal. No respiratory distress. He has no wheezes. He has rales (faint crackles in left lung base. These improved slightly with coughing.).  Lymphadenopathy:    He has no cervical adenopathy.  Neurological: He is alert and oriented to person, place, and time.    ED Course  Procedures (including critical care time)  Labs Review Labs Reviewed - No  data to display  Imaging Review Dg Chest 2 View  01/17/2016  CLINICAL DATA:  Per pt: upper respiratory and chest congestion for over a week, fever, cough, center chest pain. History of pneumonia as a child, Bronchitis. Non-smoker. Patient is not a diabetic. No history of cardiac disease. EXAM: CHEST  2 VIEW COMPARISON:  None. FINDINGS: Lungs are hyperinflated. Heart size is normal. There is mild patchy density at the right lung base, best identified on the lateral view. No pleural effusions or pulmonary edema. IMPRESSION: 1. Hyperinflation. 2.  Mild right lower lobe infiltrate. Followup PA and lateral chest X-ray is recommended in 3-4 weeks following trial of antibiotic therapy to ensure resolution and exclude underlying malignancy. Electronically Signed   By: Norva Pavlov M.D.   On: 01/17/2016 14:15     MDM   1. CAP (community acquired pneumonia)    We'll treat with azithromycin and prednisone for community acquired pneumonia. Return precautions reviewed.    Charm Rings, MD 01/17/16 [phone]

## 2016-01-17 NOTE — ED Notes (Signed)
Started with nasal congestion 1 wk ago - had tried Claritin-D, Mucinex, an OTC nasal decongestant, and saline rinses; has had slight improvement.  3 days ago started with productive cough and chest discomfort.

## 2016-01-17 NOTE — Discharge Instructions (Signed)

## 2016-01-20 ENCOUNTER — Encounter: Payer: Self-pay | Admitting: Internal Medicine

## 2016-01-20 ENCOUNTER — Ambulatory Visit (INDEPENDENT_AMBULATORY_CARE_PROVIDER_SITE_OTHER): Payer: 59 | Admitting: Internal Medicine

## 2016-01-20 VITALS — BP 140/76 | HR 91 | Temp 98.6°F | Resp 20 | Wt 218.0 lb

## 2016-01-20 DIAGNOSIS — J189 Pneumonia, unspecified organism: Secondary | ICD-10-CM | POA: Diagnosis not present

## 2016-01-20 DIAGNOSIS — I10 Essential (primary) hypertension: Secondary | ICD-10-CM | POA: Diagnosis not present

## 2016-01-20 DIAGNOSIS — J309 Allergic rhinitis, unspecified: Secondary | ICD-10-CM | POA: Diagnosis not present

## 2016-01-20 MED ORDER — HYDROCODONE-HOMATROPINE 5-1.5 MG/5ML PO SYRP: 5.0000 mL | ORAL_SOLUTION | Freq: Four times a day (QID) | ORAL | Status: DC | PRN

## 2016-01-20 NOTE — Progress Notes (Signed)
Pre visit review using our clinic review tool, if applicable. No additional management support is needed unless otherwise documented below in the visit note. 

## 2016-01-20 NOTE — Progress Notes (Signed)
Subjective:    Patient ID: Jimmy Hess, male    DOB: Jan 09, 1966, 50 y.o.   MRN: [id number]  HPI  Here to f/u recent dx and tx of RLL Pna, overall much improved with non prod cough, but  No fever, worsening sob, CP, wheezing and Pt denies orthopnea, PND, increased LE swelling, palpitations, dizziness or syncope.  Also Does have several wks ongoing nasal allergy symptoms with clearish congestion, itch and sneezing, without fever, pain, ST, cough, swelling or wheezing.  Denies urinary symptoms such as dysuria, frequency, urgency, flank pain, hematuria or n/v, fever, chills.  Denies worsening reflux, abd pain, dysphagia, n/v, bowel change or blood. Past Medical History  Diagnosis Date  . HYPERLIPIDEMIA 09/10/2008    Qualifier: Diagnosis of  By: Jonny Ruiz MD, Len Blalock   . GERD 05/16/2007    Qualifier: Diagnosis of  By: Jonny Ruiz MD, Len Blalock   . ALLERGIC RHINITIS 05/16/2007    Qualifier: Diagnosis of  By: Jonny Ruiz MD, Len Blalock   . GLUCOSE-6-PHOSPHATE DEHYDROGENASE DEFICIENCY 05/12/2007    Qualifier: Diagnosis of  By: Maris Berger   . ERECTILE DYSFUNCTION 05/12/2007    Qualifier: Diagnosis of  By: Jonny Ruiz MD, Len Blalock   . Allergy   . Arthritis   . HYPERTENSION 05/16/2007    Qualifier: Diagnosis of  By: Jonny Ruiz MD, Len Blalock; HTN meds discontinued by provider   Past Surgical History  Procedure Laterality Date  . Nasal septum surgery    . Fracture surgery    . Knee surgery      reports that he has quit smoking. He has never used smokeless tobacco. He reports that he drinks alcohol. He reports that he does not use illicit drugs. family history includes Cancer in his father; Heart failure in his maternal grandmother; Hypertension in his maternal grandmother and mother. Allergies  Allergen Reactions  . Aspirin     Cannot have due to blood deficiency  . Sulfonamide Derivatives    Current Outpatient Prescriptions on File Prior to Visit  Medication Sig Dispense Refill  . azithromycin (ZITHROMAX Z-PAK) 250 MG  tablet Take 2 pills today, then 1 pill daily until gone. 6 tablet 0  . predniSONE (DELTASONE) 50 MG tablet Take 1 pill daily for 5 days. 5 tablet 0  . [DISCONTINUED] telmisartan (MICARDIS) 40 MG tablet Take 1 tablet (40 mg total) by mouth daily. 90 tablet 3   No current facility-administered medications on file prior to visit.   Review of Systems  Constitutional: Negative for unusual diaphoresis or night sweats HENT: Negative for ear swelling or discharge Eyes: Negative for worsening visual haziness  Respiratory: Negative for choking and stridor.   Gastrointestinal: Negative for distension or worsening eructation Genitourinary: Negative for retention or change in urine volume.  Musculoskeletal: Negative for other MSK pain or swelling Skin: Negative for color change and worsening wound Neurological: Negative for tremors and numbness other than noted  Psychiatric/Behavioral: Negative for decreased concentration or agitation other than above       Objective:   Physical Exam BP 140/76 mmHg  Pulse 91  Temp(Src) 98.6 F (37 C) (Oral)  Resp 20  Wt 218 lb (98.884 kg)  SpO2 95% VS noted, non toxic, not ill appearing Constitutional: Pt appears in no apparent distress HENT: Head: NCAT.  Right Ear: External ear normal.  Left Ear: External ear normal.  Bilat tm's with mild erythema.  Max sinus areas non tender.  Pharynx with mild erythema, no exudate Eyes: . Pupils are equal,  round, and reactive to light. Conjunctivae and EOM are normal Neck: Normal range of motion. Neck supple.  Cardiovascular: Normal rate and regular rhythm.   Pulmonary/Chest: Effort normal and breath sounds without rales or wheezing.  Abd:  Soft, NT, ND, + BS Neurological: Pt is alert. Not confused , motor grossly intact Skin: Skin is warm. No rash, no LE edema Psychiatric: Pt behavior is normal. No agitation.     Assessment & Plan:

## 2016-01-20 NOTE — Patient Instructions (Signed)
You had the steroid shot today  Please take all new medication as prescribed - the cough medicine  You are given the work note  Please consider taking zyrtec otc for allergies as well  Please continue all other medications as before, and refills have been done if requested.  Please have the pharmacy call with any other refills you may need  Please keep your appointments with your specialists as you may have planned

## 2016-01-24 NOTE — Assessment & Plan Note (Signed)
stable overall by history and exam, recent data reviewed with pt, and pt to continue medical treatment as before,  to f/u any worsening symptoms or concerns BP Readings from Last 3 Encounters:  01/20/16 140/76  01/17/16 146/83  10/21/15 136/80

## 2016-01-24 NOTE — Assessment & Plan Note (Signed)
Mild to mod, for depomedrol IM, predpac asd, to f/u any worsening symptoms or concerns 

## 2016-01-24 NOTE — Assessment & Plan Note (Addendum)
Improved, no further antibx, needed, for cough med prn,  to f/u any worsening symptoms or concerns, consider f/u cxr at 4 wks - declines for now

## 2016-05-08 ENCOUNTER — Other Ambulatory Visit: Payer: Self-pay | Admitting: Internal Medicine

## 2016-05-31 ENCOUNTER — Encounter: Payer: Self-pay | Admitting: Internal Medicine

## 2016-05-31 ENCOUNTER — Ambulatory Visit (INDEPENDENT_AMBULATORY_CARE_PROVIDER_SITE_OTHER): Payer: 59 | Admitting: Internal Medicine

## 2016-05-31 VITALS — BP 128/78 | HR 82 | Temp 98.5°F | Resp 20 | Wt 217.4 lb

## 2016-05-31 DIAGNOSIS — J019 Acute sinusitis, unspecified: Secondary | ICD-10-CM | POA: Diagnosis not present

## 2016-05-31 DIAGNOSIS — I10 Essential (primary) hypertension: Secondary | ICD-10-CM | POA: Diagnosis not present

## 2016-05-31 DIAGNOSIS — J309 Allergic rhinitis, unspecified: Secondary | ICD-10-CM

## 2016-05-31 MED ORDER — LEVOFLOXACIN 500 MG PO TABS: 500.0000 mg | ORAL_TABLET | Freq: Every day | ORAL | 0 refills | Status: AC

## 2016-05-31 NOTE — Patient Instructions (Signed)
Please take all new medication as prescribed - the antibiotic  Please continue all other medications as before, and refills have been done if requested.  Please have the pharmacy call with any other refills you may need.  Please keep your appointments with your specialists as you may have planned   

## 2016-05-31 NOTE — Progress Notes (Signed)
Subjective:    Patient ID: Jimmy Hess, male    DOB: 1966-03-10, 50 y.o.   MRN: [id number]  HPI   Here with 2-3 days acute onset fever, right facial pain, pressure, headache, general weakness and malaise, and greenish d/c with some blood as well, with mild ST and non prod cough, but pt denies wheezing, increased sob or doe, orthopnea, PND, increased LE swelling, palpitations, dizziness or syncope.  Has had some several months of intermittent right lower lateral CP, sharp and pleuritic, mild but keeps recurring the more he lifts wts,  Does have several wks ongoing nasal allergy symptoms with clearish congestion, itch and sneezing, without fever, pain, ST, cough, swelling or wheezing. Past Medical History:  Diagnosis Date  . ALLERGIC RHINITIS 05/16/2007   Qualifier: Diagnosis of  By: Jonny Ruiz MD, Len Blalock   . Allergy   . Arthritis   . ERECTILE DYSFUNCTION 05/12/2007   Qualifier: Diagnosis of  By: Jonny Ruiz MD, Len Blalock   . GERD 05/16/2007   Qualifier: Diagnosis of  By: Jonny Ruiz MD, Len Blalock   . GLUCOSE-6-PHOSPHATE DEHYDROGENASE DEFICIENCY 05/12/2007   Qualifier: Diagnosis of  By: Maris Berger   . HYPERLIPIDEMIA 09/10/2008   Qualifier: Diagnosis of  By: Jonny Ruiz MD, Len Blalock   . HYPERTENSION 05/16/2007   Qualifier: Diagnosis of  By: Jonny Ruiz MD, Len Blalock; HTN meds discontinued by provider   Past Surgical History:  Procedure Laterality Date  . FRACTURE SURGERY    . KNEE SURGERY    . NASAL SEPTUM SURGERY      reports that he has quit smoking. He has never used smokeless tobacco. He reports that he drinks alcohol. He reports that he does not use drugs. family history includes Cancer in his father; Heart failure in his maternal grandmother; Hypertension in his maternal grandmother and mother. Allergies  Allergen Reactions  . Aspirin     Cannot have due to blood deficiency  . Sulfonamide Derivatives    Current Outpatient Prescriptions on File Prior to Visit  Medication Sig Dispense Refill  . telmisartan  (MICARDIS) 40 MG tablet TAKE 1 TABLET (40 MG TOTAL) BY MOUTH DAILY. 90 tablet 1   No current facility-administered medications on file prior to visit.    Review of Systems  Constitutional: Negative for unusual diaphoresis or night sweats HENT: Negative for ear swelling or discharge Eyes: Negative for worsening visual haziness  Respiratory: Negative for choking and stridor.   Gastrointestinal: Negative for distension or worsening eructation Genitourinary: Negative for retention or change in urine volume.  Musculoskeletal: Negative for other MSK pain or swelling Skin: Negative for color change and worsening wound Neurological: Negative for tremors and numbness other than noted  Psychiatric/Behavioral: Negative for decreased concentration or agitation other than above       Objective:   Physical Exam BP 128/78   Pulse 82   Temp 98.5 F (36.9 C) (Oral)   Resp 20   Wt 217 lb 6 oz (98.6 kg)   SpO2 97%   BMI 27.91 kg/m  VS noted, mild ill Constitutional: Pt appears in no apparent distress HENT: Head: NCAT.  Right Ear: External ear normal.  Left Ear: External ear normal.  Bilat tm's with mild erythema.  Max sinus areas mild tender.  Pharynx with mild erythema, no exudate Eyes: . Pupils are equal, round, and reactive to light. Conjunctivae and EOM are normal Neck: Normal range of motion. Neck supple.  Cardiovascular: Normal rate and regular rhythm.   Pulmonary/Chest: Effort normal  and breath sounds without rales or wheezing.  Neurological: Pt is alert. Not confused , motor grossly intact Skin: Skin is warm. No rash, no LE edema Psychiatric: Pt behavior is normal. No agitation.  No chest wall tender, redness, swelling or rash at the right lateral chest, ant axillary line     Assessment & Plan:

## 2016-05-31 NOTE — Assessment & Plan Note (Signed)
stable overall by history and exam, recent data reviewed with pt, and pt to continue medical treatment as before,  to f/u any worsening symptoms or concerns BP Readings from Last 3 Encounters:  05/31/16 128/78  01/20/16 140/76  01/17/16 146/83

## 2016-05-31 NOTE — Progress Notes (Signed)
Pre visit review using our clinic review tool, if applicable. No additional management support is needed unless otherwise documented below in the visit note. 

## 2016-05-31 NOTE — Assessment & Plan Note (Signed)
To restart otc antihist and nasacort asd

## 2016-05-31 NOTE — Assessment & Plan Note (Signed)
Mild to mod, for antibx course,  to f/u any worsening symptoms or concerns 

## 2016-07-19 ENCOUNTER — Telehealth: Payer: Self-pay | Admitting: Internal Medicine

## 2016-07-19 NOTE — Telephone Encounter (Signed)
Patient Name: Bunnie PionRODNEY Longstreth DOB: [birth year] Initial Comment Caller states he has numbness, and tingling in his left arm. Nurse Assessment Nurse: Enid SkeensHathaway, RN, Arline Aspindy Date/Time (Eastern Time): 07/19/2016 2:18:16 PM Confirm and document reason for call. If symptomatic, describe symptoms. You must click the next button to save text entered. ---Caller states he has numbness, and tingling in his left arm. Has the patient traveled out of the country within the last 30 days? ---No Does the patient have any new or worsening symptoms? ---Yes Will a triage be completed? ---Yes Related visit to physician within the last 2 weeks? ---No Does the PT have any chronic conditions? (i.e. diabetes, asthma, etc.) ---Yes List chronic conditions. ---htn Is this a behavioral health or substance abuse call? ---No Guidelines Guideline Title Affirmed Question Affirmed Notes Neurologic Deficit [1] Numbness or tingling in one or both hands AND [2] is a chronic symptom (recurrent or ongoing AND present > 4 weeks) Final Disposition User See PCP When Office is Open (within 3 days) Enid SkeensHathaway, RN, Arline Aspindy Comments Appt scheduled for 07/20/16 at 530 pm with pt PCP. Nurse reached pt and verified appt information with him. Referrals REFERRED TO PCP OFFICE Disagree/Comply: Comply

## 2016-07-20 ENCOUNTER — Ambulatory Visit (INDEPENDENT_AMBULATORY_CARE_PROVIDER_SITE_OTHER): Payer: 59 | Admitting: Internal Medicine

## 2016-07-20 VITALS — BP 116/78 | HR 71 | Temp 98.9°F | Resp 16 | Wt 216.0 lb

## 2016-07-20 DIAGNOSIS — M79602 Pain in left arm: Secondary | ICD-10-CM

## 2016-07-20 DIAGNOSIS — M25512 Pain in left shoulder: Secondary | ICD-10-CM | POA: Diagnosis not present

## 2016-07-20 DIAGNOSIS — I10 Essential (primary) hypertension: Secondary | ICD-10-CM | POA: Diagnosis not present

## 2016-07-20 MED ORDER — MELOXICAM 15 MG PO TABS: 15.0000 mg | ORAL_TABLET | Freq: Every day | ORAL | 2 refills | Status: DC

## 2016-07-20 NOTE — Assessment & Plan Note (Signed)
C/w likely neuritic pain, mild, for mobic prn, rest and consider change work position, also for UE NCS/EMG r/o CTS vs other

## 2016-07-20 NOTE — Progress Notes (Signed)
Patient received education resource, including the self-management goal and tool. Patient verbalized understanding. 

## 2016-07-20 NOTE — Assessment & Plan Note (Signed)
stable overall by history and exam, recent data reviewed with pt, and pt to continue medical treatment as before,  to f/u any worsening symptoms or concerns BP Readings from Last 3 Encounters:  07/20/16 116/78  05/31/16 128/78  01/20/16 140/76

## 2016-07-20 NOTE — Progress Notes (Signed)
Subjective:    Patient ID: Jimmy Hess, male    DOB: 03/20/1966, 50 y.o.   MRN: [id number]  HPI  Here with c/o LUE pain, started about 18 mo ago mild to mod persisntent for several months with doing sorting work 3 hrs per day, moving packages left to right, then moved to a different job and symptoms resolved, then back to sorting 3 hrs per day with c/o same but gradually worse pain/numbness to entire arm, not sure if worse at wrist, elbow but certainly has no neck pain though neck involved in sorting by looking left and right as well.  Has a kind of soreness that is different on top of that to post left shoulder.  No LUE weakness.  Pain for some reason worse at night whether he lies on left or right side.  Nothing else makes better or worse.  Pt denies chest pain, increased sob or doe, wheezing, orthopnea, PND, increased LE swelling, palpitations, dizziness or syncope.   Pt denies polydipsia, polyuria,   Past Medical History:  Diagnosis Date  . ALLERGIC RHINITIS 05/16/2007   Qualifier: Diagnosis of  By: Jonny Ruiz MD, Len Blalock   . Allergy   . Arthritis   . ERECTILE DYSFUNCTION 05/12/2007   Qualifier: Diagnosis of  By: Jonny Ruiz MD, Len Blalock   . GERD 05/16/2007   Qualifier: Diagnosis of  By: Jonny Ruiz MD, Len Blalock   . GLUCOSE-6-PHOSPHATE DEHYDROGENASE DEFICIENCY 05/12/2007   Qualifier: Diagnosis of  By: Maris Berger   . HYPERLIPIDEMIA 09/10/2008   Qualifier: Diagnosis of  By: Jonny Ruiz MD, Len Blalock   . HYPERTENSION 05/16/2007   Qualifier: Diagnosis of  By: Jonny Ruiz MD, Len Blalock; HTN meds discontinued by provider   Past Surgical History:  Procedure Laterality Date  . FRACTURE SURGERY    . KNEE SURGERY    . NASAL SEPTUM SURGERY      reports that he has quit smoking. He has never used smokeless tobacco. He reports that he drinks alcohol. He reports that he does not use drugs. family history includes Cancer in his father; Heart failure in his maternal grandmother; Hypertension in his maternal grandmother and  mother. Allergies  Allergen Reactions  . Aspirin     Cannot have due to blood deficiency  . Sulfonamide Derivatives    Current Outpatient Prescriptions on File Prior to Visit  Medication Sig Dispense Refill  . telmisartan (MICARDIS) 40 MG tablet TAKE 1 TABLET (40 MG TOTAL) BY MOUTH DAILY. 90 tablet 1   No current facility-administered medications on file prior to visit.    Review of Systems  Constitutional: Negative for unusual diaphoresis or night sweats HENT: Negative for ear swelling or discharge Eyes: Negative for worsening visual haziness  Respiratory: Negative for choking and stridor.   Gastrointestinal: Negative for distension or worsening eructation Genitourinary: Negative for retention or change in urine volume.  Musculoskeletal: Negative for other MSK pain or swelling Skin: Negative for color change and worsening wound Neurological: Negative for tremors and numbness other than noted  Psychiatric/Behavioral: Negative for decreased concentration or agitation other than above   All other system neg per pt     Objective:   Physical Exam BP 116/78   Pulse 71   Temp 98.9 F (37.2 C) (Oral)   Resp 16   Wt 216 lb (98 kg)   SpO2 97%   BMI 27.73 kg/m  VS noted,  Constitutional: Pt appears in no apparent distress HENT: Head: NCAT.  Right Ear: External ear  normal.  Left Ear: External ear normal.  Eyes: . Pupils are equal, round, and reactive to light. Conjunctivae and EOM are normal Neck: Normal range of motion. Neck supple.  Cardiovascular: Normal rate and regular rhythm.   Pulmonary/Chest: Effort normal and breath sounds without rales or wheezing.  Left shoulder with mild tenderness posteriorly without swelling or rash, FROM Neurological: Pt is alert. Not confused , motor 5/5 intact, cn 2-12 intact, sens/dtr intact to UE's Skin: Skin is warm. No rash, no LE edema Psychiatric: Pt behavior is normal. No agitation.     Assessment & Plan:

## 2016-07-20 NOTE — Assessment & Plan Note (Signed)
Mild to mod, suspect mild tendinopathy,  For nsaid prn, to f/u any worsening symptoms or concerns

## 2016-07-20 NOTE — Patient Instructions (Signed)
Please take all new medication as prescribed - the anti-inflammatory  Try to rest from using the arm for work as much as possible  You will be contacted regarding the referral for: Nerve test for the arms  Please continue all other medications as before, and refills have been done if requested.  Please have the pharmacy call with any other refills you may need.  Please keep your appointments with your specialists as you may have planned

## 2016-08-15 ENCOUNTER — Encounter: Payer: Self-pay | Admitting: Gastroenterology

## 2016-09-26 ENCOUNTER — Telehealth: Payer: Self-pay | Admitting: *Deleted

## 2016-09-26 NOTE — Telephone Encounter (Signed)
Patient no show PV today. Called pt, no answer, left message for patient to call us back to reschedule this appointment.

## 2016-09-28 ENCOUNTER — Ambulatory Visit (AMBULATORY_SURGERY_CENTER): Payer: Self-pay | Admitting: *Deleted

## 2016-09-28 VITALS — Ht 72.0 in | Wt 217.0 lb

## 2016-09-28 DIAGNOSIS — Z1211 Encounter for screening for malignant neoplasm of colon: Secondary | ICD-10-CM

## 2016-09-28 MED ORDER — NA SULFATE-K SULFATE-MG SULF 17.5-3.13-1.6 GM/177ML PO SOLN: ORAL | 0 refills | Status: DC

## 2016-09-28 NOTE — Progress Notes (Signed)
Patient denies any allergies to eggs or soy. Patient denies any problems with anesthesia/sedation. Patient denies any oxygen use at home and does not take any diet/weight loss medications. EMMI education declined by patient.  

## 2016-10-05 ENCOUNTER — Ambulatory Visit (INDEPENDENT_AMBULATORY_CARE_PROVIDER_SITE_OTHER): Payer: 59 | Admitting: Neurology

## 2016-10-05 ENCOUNTER — Encounter: Payer: Self-pay | Admitting: Neurology

## 2016-10-05 ENCOUNTER — Encounter (INDEPENDENT_AMBULATORY_CARE_PROVIDER_SITE_OTHER): Payer: Self-pay

## 2016-10-05 DIAGNOSIS — G5622 Lesion of ulnar nerve, left upper limb: Secondary | ICD-10-CM

## 2016-10-05 DIAGNOSIS — M79602 Pain in left arm: Secondary | ICD-10-CM

## 2016-10-05 DIAGNOSIS — G5602 Carpal tunnel syndrome, left upper limb: Secondary | ICD-10-CM | POA: Diagnosis not present

## 2016-10-05 DIAGNOSIS — Z0289 Encounter for other administrative examinations: Secondary | ICD-10-CM

## 2016-10-05 NOTE — Progress Notes (Signed)
Full Name: Jimmy Hess Gender: Male MRN #: [id number] Date of Birth: [birth year]    Visit Date: 10/05/2016 13:57 Age: 51 Years 10 Months Old Examining Physician: Despina Arias, MD  Referring Physician: Dr. Jonny Ruiz    History: Mr. Hunsaker is a 51 year old man who has had numbness and pain in the left arm worse in the hand.   On examination, sensation was fairly normal but he had very mild weakness in median and ulnar are innervated intrinsic hand muscles, and attentive to the right side.   Additionally, he had Tinel's signs at the left elbow (ulnar) and left wrist (median).  Nerve conduction studies: The left ulnar motor response had a borderline normal distal latency and reduced conduction velocity across the elbow with mild reduction of amplitude. The left ulnar sensory response was nonresponsive. The left ulnar F-wave response was mildly slowed.   The left median sensory response was mildly slowed with reduced amplitude. Left median motor response had a mildly delayed distal latency with normal forearm conduction and velocity.  The right ulnar motor and sensory responses were normal. The right ulnar F-wave response was normal. The right median motor response was normal. The right median sensory response had a borderline peak latency at the wrist normal amplitude.      EMG: Needle EMG of the left deltoid, triceps, biceps, flexor carpi ulnaris, extensor digitoris communis, first dorsal interosseous and abductor pollicis brevis muscles was performed.    Spontaneous activity was not observed in any of these muscles. Motor unit morphology and recruitment was normal.   Conclusion: This nerve conduction study/electromyogram shows the following: 1.   Moderate ulnar neuropathy at the left elbow. 2.   Moderate median neuropathy (carpal tunnel syndrome) at the left wrist. 3.   Borderline median neuropathy at the right wrist (asymptomatic) 4.   There was no evidence of superimposed  radiculopathy.    ------------------------------- Pearletha Furl. Epimenio Foot, MD, PhD Certified in Neurology, Clinical Neurophysiology, Sleep Medicine, Pain Medicine and Neuroimaging  [facility] [address], [address] [place], [place] [address] [phone]      [facility]    Nerve / Sites Rec. Site Peak Lat Ref. Amp.1-2 Ref. Distance    ms ms V V cm  L Ulnar - Digit V (Antidromic)     Wrist Dig V NR ?3.10 NR ?17.0 11  L Median, Ulnar - Transcarpal comparison     Median Palm Wrist 2.81 ?2.20 8.0 ?40.0 8     Ulnar Palm Wrist NR ?2.20 NR ?12.0 8          R Median, Ulnar - Transcarpal comparison     Median Palm Wrist 2.66 ?2.20 5.7 ?40.0 8     Ulnar Palm Wrist 2.97 ?2.20 3.6 ?12.0 8          L Median - Orthodromic (Dig II, Mid palm)     Dig II Wrist 4.01 ?3.40 4.2 ?10.0 13  R Median - Orthodromic (Dig II, Mid palm)     Dig II Wrist 3.44 ?3.40 11.1 ?10.0 13  R Ulnar - Orthodromic, (Dig V, Mid palm)     Dig V Wrist 2.34 ?3.10 8.5 ?5.0 11     [facility]    Nerve / Sites Muscle Latency Ref. Amplitude Ref. Rel Amp Segments Distance Lat Diff Velocity Ref. Area    ms ms mV mV %  cm ms m/s m/s mVms  L Median - APB     Wrist APB 4.6 ?4.4  10.2 ?4.0 100 Wrist - APB 7    33.8     Upper arm APB 9.3  9.9  97.3 Upper arm - Wrist 24 4.7 51  33.7  R Median - APB     Wrist APB 3.6 ?4.4 13.7 ?4.0 100 Wrist - APB 7    41.0     Upper arm APB 8.3  13.7  100 Upper arm - Wrist 24 4.7 51  40.5  L Ulnar - ADM     Wrist ADM 3.3 ?3.3 7.8 ?6.0 100 Wrist - ADM 7    28.3     B.Elbow ADM 7.4  6.4  82.9 B.Elbow - Wrist 21 4.2 50 ?49 25.5     A.Elbow ADM 10.7  5.3  81.6 A.Elbow - B.Elbow 11 3.3 34 ?49 23.2         A.Elbow - Wrist  7.4     R Ulnar - ADM     Wrist ADM 2.6 ?3.3 10.3 ?6.0 100 Wrist - ADM 7    32.9     B.Elbow ADM 6.8  9.2  89 B.Elbow - Wrist 21 4.3 49 ?49 31.0     A.Elbow ADM 9.7  8.9  97.2 A.Elbow - B.Elbow 12 2.9 42 ?49 31.2         A.Elbow - Wrist  7.1        F  Wave    Nerve F Lat  Ref.   ms ms  L Ulnar - ADM 36.5 ?32.0  R Ulnar - ADM 32.7 ?32.0     EMG full

## 2016-10-10 ENCOUNTER — Encounter: Payer: 59 | Admitting: Gastroenterology

## 2016-10-12 IMAGING — DX DG CHEST 2V
2 series · 3 of 3 positions shown · non-contrast
Comparison: None.

CLINICAL DATA: Per pt: upper respiratory and chest congestion for
over a week, fever, cough, center chest pain. History of pneumonia
history of cardiac disease.

EXAM:
CHEST  2 VIEW

[Series 1: chest pa · 0.14mm/px · 2 of 2 slices shown]
[im 1/2]
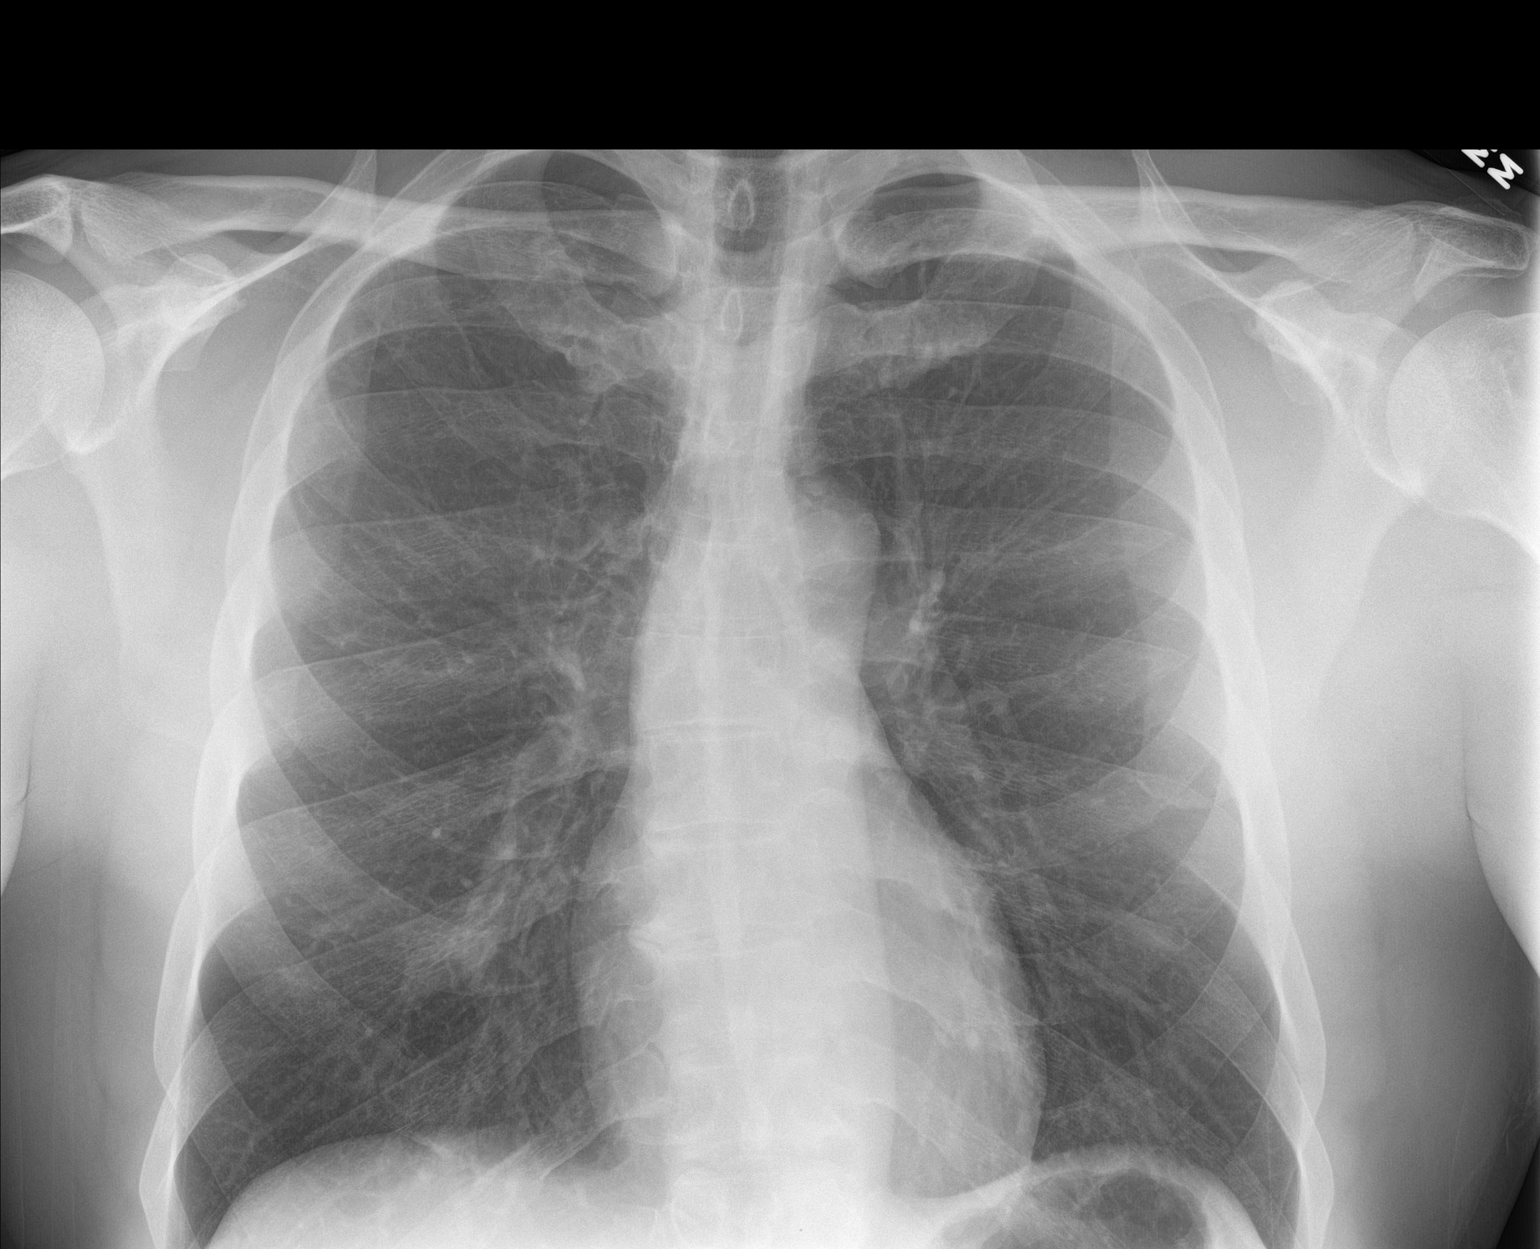
[im 2/2]
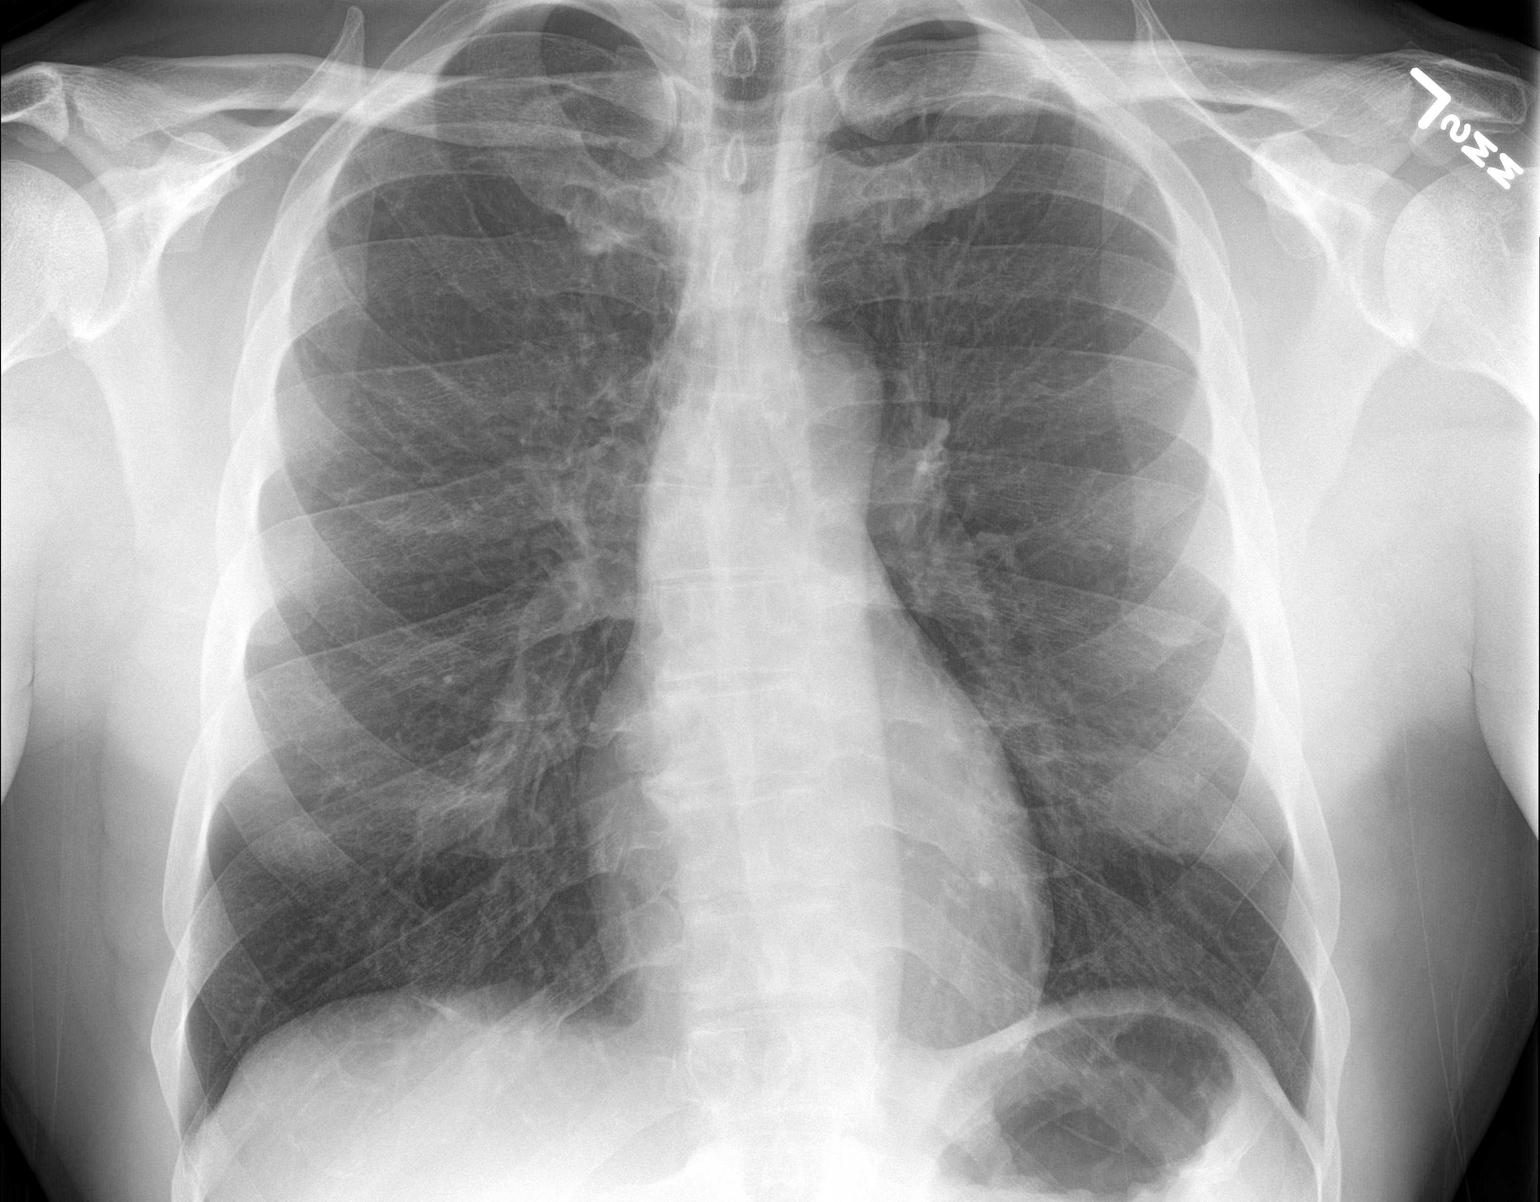

[chest lat]
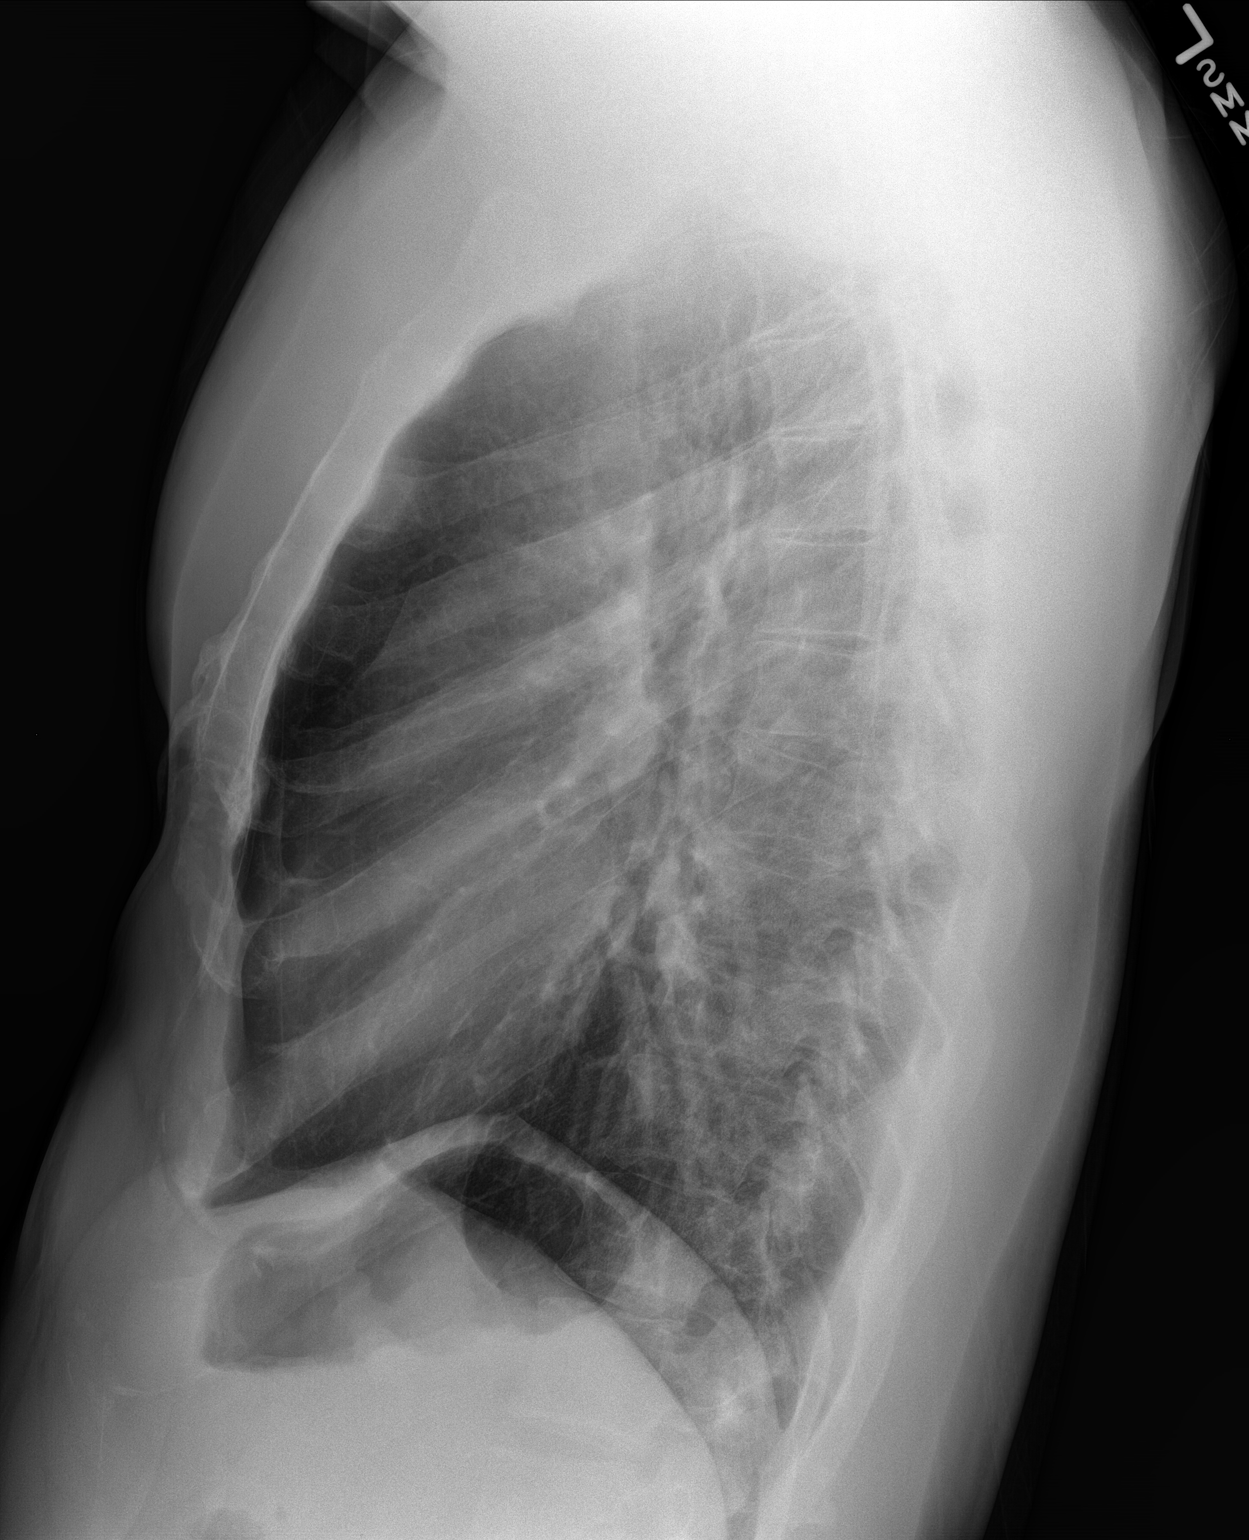

[3 of 3 positions shown; findings below may reference images not displayed]

FINDINGS: Lungs are hyperinflated. Heart size is normal. There is mild patchy
density at the right lung base, best identified on the lateral view.
No pleural effusions or pulmonary edema.
IMPRESSION: 1. Hyperinflation.
2. Mild right lower lobe infiltrate. Followup PA and lateral chest
X-ray is recommended in 3-4 weeks following trial of antibiotic
therapy to ensure resolution and exclude underlying malignancy.

## 2016-10-19 ENCOUNTER — Encounter: Payer: 59 | Admitting: Internal Medicine

## 2016-10-27 ENCOUNTER — Other Ambulatory Visit (INDEPENDENT_AMBULATORY_CARE_PROVIDER_SITE_OTHER): Payer: 59

## 2016-10-27 ENCOUNTER — Ambulatory Visit (INDEPENDENT_AMBULATORY_CARE_PROVIDER_SITE_OTHER): Payer: 59 | Admitting: Internal Medicine

## 2016-10-27 ENCOUNTER — Encounter: Payer: Self-pay | Admitting: Internal Medicine

## 2016-10-27 VITALS — BP 118/80 | HR 76 | Temp 98.6°F | Ht 73.5 in | Wt 217.0 lb

## 2016-10-27 DIAGNOSIS — R05 Cough: Secondary | ICD-10-CM | POA: Diagnosis not present

## 2016-10-27 DIAGNOSIS — Z0001 Encounter for general adult medical examination with abnormal findings: Secondary | ICD-10-CM | POA: Diagnosis not present

## 2016-10-27 DIAGNOSIS — R062 Wheezing: Secondary | ICD-10-CM | POA: Diagnosis not present

## 2016-10-27 DIAGNOSIS — I10 Essential (primary) hypertension: Secondary | ICD-10-CM

## 2016-10-27 DIAGNOSIS — R059 Cough, unspecified: Secondary | ICD-10-CM

## 2016-10-27 DIAGNOSIS — E785 Hyperlipidemia, unspecified: Secondary | ICD-10-CM

## 2016-10-27 DIAGNOSIS — Z0000 Encounter for general adult medical examination without abnormal findings: Secondary | ICD-10-CM

## 2016-10-27 LAB — URINALYSIS, ROUTINE W REFLEX MICROSCOPIC
Bilirubin Urine: NEGATIVE
Hgb urine dipstick: NEGATIVE
KETONES UR: NEGATIVE
Leukocytes, UA: NEGATIVE
Nitrite: NEGATIVE
PH: 6.5 (ref 5.0–8.0)
RBC / HPF: NONE SEEN (ref 0–?)
Total Protein, Urine: NEGATIVE
URINE GLUCOSE: NEGATIVE
Urobilinogen, UA: 0.2 (ref 0.0–1.0)
WBC, UA: NONE SEEN (ref 0–?)

## 2016-10-27 LAB — CBC WITH DIFFERENTIAL/PLATELET
BASOS ABS: 0 10*3/uL (ref 0.0–0.1)
BASOS PCT: 0.4 % (ref 0.0–3.0)
EOS ABS: 0 10*3/uL (ref 0.0–0.7)
Eosinophils Relative: 1.1 % (ref 0.0–5.0)
HCT: 39.4 % (ref 39.0–52.0)
Hemoglobin: 13.5 g/dL (ref 13.0–17.0)
LYMPHS ABS: 1.3 10*3/uL (ref 0.7–4.0)
LYMPHS PCT: 36.8 % (ref 12.0–46.0)
MCHC: 34.4 g/dL (ref 30.0–36.0)
MCV: 93.5 fl (ref 78.0–100.0)
MONO ABS: 0.5 10*3/uL (ref 0.1–1.0)
Monocytes Relative: 14.6 % — ABNORMAL HIGH (ref 3.0–12.0)
NEUTROS ABS: 1.6 10*3/uL (ref 1.4–7.7)
NEUTROS PCT: 47.1 % (ref 43.0–77.0)
PLATELETS: 154 10*3/uL (ref 150.0–400.0)
RBC: 4.21 Mil/uL — ABNORMAL LOW (ref 4.22–5.81)
RDW: 11.7 % (ref 11.5–15.5)
WBC: 3.4 10*3/uL — ABNORMAL LOW (ref 4.0–10.5)

## 2016-10-27 LAB — HEPATIC FUNCTION PANEL
ALT: 22 U/L (ref 0–53)
AST: 30 U/L (ref 0–37)
Albumin: 4.4 g/dL (ref 3.5–5.2)
Alkaline Phosphatase: 54 U/L (ref 39–117)
BILIRUBIN DIRECT: 0.1 mg/dL (ref 0.0–0.3)
TOTAL PROTEIN: 7 g/dL (ref 6.0–8.3)
Total Bilirubin: 0.5 mg/dL (ref 0.2–1.2)

## 2016-10-27 LAB — LIPID PANEL
Cholesterol: 137 mg/dL (ref 0–200)
HDL: 46.1 mg/dL (ref >39.00)
LDL Cholesterol: 73 mg/dL (ref 0–99)
NonHDL: 90.88
TRIGLYCERIDES: 88 mg/dL (ref 0.0–149.0)
Total CHOL/HDL Ratio: 3
VLDL: 17.6 mg/dL (ref 0.0–40.0)

## 2016-10-27 LAB — BASIC METABOLIC PANEL
BUN: 18 mg/dL (ref 6–23)
CHLORIDE: 102 meq/L (ref 96–112)
CO2: 33 meq/L — AB (ref 19–32)
CREATININE: 1.17 mg/dL (ref 0.40–1.50)
Calcium: 9.6 mg/dL (ref 8.4–10.5)
GFR: 84.55 mL/min (ref >60.00)
Glucose, Bld: 69 mg/dL — ABNORMAL LOW (ref 70–99)
POTASSIUM: 4.2 meq/L (ref 3.5–5.1)
Sodium: 140 mEq/L (ref 135–145)

## 2016-10-27 LAB — PSA: PSA: 0.54 ng/mL (ref 0.10–4.00)

## 2016-10-27 MED ORDER — ALBUTEROL SULFATE HFA 108 (90 BASE) MCG/ACT IN AERS: 2.0000 | INHALATION_SPRAY | Freq: Four times a day (QID) | RESPIRATORY_TRACT | 2 refills | Status: DC | PRN

## 2016-10-27 MED ORDER — AZITHROMYCIN 250 MG PO TABS
ORAL_TABLET | ORAL | 1 refills | Status: DC
Start: 2016-10-27 — End: 2016-11-15

## 2016-10-27 MED ORDER — HYDROCODONE-HOMATROPINE 5-1.5 MG/5ML PO SYRP: 5.0000 mL | ORAL_SOLUTION | Freq: Four times a day (QID) | ORAL | 0 refills | Status: AC | PRN

## 2016-10-27 MED ORDER — PREDNISONE 10 MG PO TABS
ORAL_TABLET | ORAL | 0 refills | Status: DC
Start: 2016-10-27 — End: 2016-11-15

## 2016-10-27 NOTE — Patient Instructions (Signed)
Please take all new medication as prescribed - the antibiotic., cough medicine, prednisone and inhaler  Please continue all other medications as before, and refills have been done if requested.  Please have the pharmacy call with any other refills you may need.  Please continue your efforts at being more active, low cholesterol diet, and weight control.  You are otherwise up to date with prevention measures today.  Please keep your appointments with your specialists as you may have planned  Please go to the LAB in the Basement (turn left off the elevator) for the tests to be done today  You will be contacted by phone if any changes need to be made immediately.  Otherwise, you will receive a letter about your results with an explanation, but please check with MyChart first.  Please remember to sign up for MyChart if you have not done so, as this will be important to you in the future with finding out test results, communicating by private email, and scheduling acute appointments online when needed.  Please return in 1 year for your yearly visit, or sooner if needed, with Lab testing done 3-5 days before

## 2016-10-27 NOTE — Progress Notes (Signed)
Subjective:    Patient ID: Jimmy Hess, male    DOB: 05-Jul-1966, 51 y.o.   MRN: [id number]  HPI  Here for wellness and f/u;  Overall doing ok;  Pt denies neurological change such as new headache, facial or extremity weakness.  Pt denies polydipsia, polyuria, or low sugar symptoms. Pt states overall good compliance with treatment and medications, good tolerability, and has been trying to follow appropriate diet.  Pt denies worsening depressive symptoms, suicidal ideation or panic. No fever, night sweats, wt loss, loss of appetite, or other constitutional symptoms.  Pt states good ability with ADL's, has low fall risk, home safety reviewed and adequate, no other significant changes in hearing or vision, and only occasionally active with exercise. Declines flu shot, has colonoscopy sched for 2 wks.    Here with acute onset mild to mod 2-3 days ST, HA, general weakness and malaise, with prod cough greenish sputum and whjeezing sob,, but Pt denies chest pain, orthopnea, PND, increased LE swelling, palpitations, dizziness or syncope. Past Medical History:  Diagnosis Date  . ALLERGIC RHINITIS 05/16/2007   Qualifier: Diagnosis of  By: Jonny Ruiz MD, Len Blalock   . Allergy   . Arthritis   . ERECTILE DYSFUNCTION 05/12/2007   Qualifier: Diagnosis of  By: Jonny Ruiz MD, Len Blalock   . GERD 05/16/2007   Qualifier: Diagnosis of  By: Jonny Ruiz MD, Len Blalock   . Glucose 6 phosphatase deficiency (HCC)   . GLUCOSE-6-PHOSPHATE DEHYDROGENASE DEFICIENCY 05/12/2007   Qualifier: Diagnosis of  By: Maris Berger   . HYPERLIPIDEMIA 09/10/2008   Qualifier: Diagnosis of  By: Jonny Ruiz MD, Len Blalock   . HYPERTENSION 05/16/2007   Qualifier: Diagnosis of  By: Jonny Ruiz MD, Len Blalock; HTN meds discontinued by provider   Past Surgical History:  Procedure Laterality Date  . FRACTURE SURGERY     ankle repair  . KNEE SURGERY    . NASAL SEPTUM SURGERY      reports that he has quit smoking. He has never used smokeless tobacco. He reports that he drinks  alcohol. He reports that he does not use drugs. family history includes Heart failure in his maternal grandmother; Hypertension in his maternal grandmother and mother; Prostate cancer in his father. Allergies  Allergen Reactions  . Aspirin Other (See Comments)    Cannot have due to blood deficiency  . Sulfonamide Derivatives Other (See Comments)    Patient unsure   Current Outpatient Prescriptions on File Prior to Visit  Medication Sig Dispense Refill  . Amino Acids (AMINO ACID PO) Take 1 tablet by mouth daily.    . meloxicam (MOBIC) 15 MG tablet Take 1 tablet (15 mg total) by mouth daily. As needed for pain 30 tablet 2  . Multiple Vitamin (MULTIVITAMIN) tablet Take 1 tablet by mouth daily.    Marland Kitchen telmisartan (MICARDIS) 40 MG tablet TAKE 1 TABLET (40 MG TOTAL) BY MOUTH DAILY. 90 tablet 1   No current facility-administered medications on file prior to visit.    Review of Systems Constitutional: Negative for increased diaphoresis, or other activity, appetite or siginficant weight change other than noted HENT: Negative for worsening hearing loss, ear pain, facial swelling, mouth sores and neck stiffness.   Eyes: Negative for other worsening pain, redness or visual disturbance.  Respiratory: Negative for choking or stridor Cardiovascular: Negative for other chest pain and palpitations.  Gastrointestinal: Negative for worsening diarrhea, blood in stool, or abdominal distention Genitourinary: Negative for hematuria, flank pain or change in urine volume.  Musculoskeletal: Negative for myalgias or other joint complaints.  Skin: Negative for other color change and wound or drainage.  Neurological: Negative for syncope and numbness. other than noted Hematological: Negative for adenopathy. or other swelling Psychiatric/Behavioral: Negative for hallucinations, SI, self-injury, decreased concentration or other worsening agitation.  All other system neg per pt    Objective:   Physical Exam BP  118/80   Pulse 76   Temp 98.6 F (37 C)   Ht 6' 1.5" (1.867 m)   Wt 217 lb (98.4 kg)   SpO2 98%   BMI 28.24 kg/m  VS noted, mild ill Constitutional: Pt is oriented to person, place, and time. Appears well-developed and well-nourished, in no significant distress Head: Normocephalic and atraumatic  Eyes: Conjunctivae and EOM are normal. Pupils are equal, round, and reactive to light Right Ear: External ear normal.  Left Ear: External ear normal Nose: Nose normal.  Bilat tm's with mild erythema.  Max sinus areas non tender.  Pharynx with mild erythema, no exudate Mouth/Throat: Oropharynx is clear and moist  Neck: Normal range of motion. Neck supple. No JVD present. No tracheal deviation present or significant neck LA or mass Cardiovascular: Normal rate, regular rhythm, normal heart sounds and intact distal pulses.   Pulmonary/Chest: Effort normal and breath sounds decreased without rales or wheezing  Abdominal: Soft. Bowel sounds are normal. NT. No HSM  Musculoskeletal: Normal range of motion. Exhibits no edema Lymphadenopathy: Has no cervical adenopathy.  Neurological: Pt is alert and oriented to person, place, and time. Pt has normal reflexes. No cranial nerve deficit. Motor grossly intact Skin: Skin is warm and dry. No rash noted or new ulcers Psychiatric:  Has normal mood and affect. Behavior is normal.  No other new exam findings    Assessment & Plan:

## 2016-10-28 ENCOUNTER — Encounter: Payer: Self-pay | Admitting: Internal Medicine

## 2016-10-28 LAB — HIV ANTIBODY (ROUTINE TESTING W REFLEX): HIV: NONREACTIVE

## 2016-10-28 LAB — TSH: TSH: 0.8 u[IU]/mL (ref 0.35–4.50)

## 2016-10-30 NOTE — Assessment & Plan Note (Signed)
stable overall by history and exam, recent data reviewed with pt, and pt to continue medical treatment as before,  to f/u any worsening symptoms or concerns BP Readings from Last 3 Encounters:  10/27/16 118/80  07/20/16 116/78  05/31/16 128/78

## 2016-10-30 NOTE — Assessment & Plan Note (Signed)
stable overall by history and exam, recent data reviewed with pt, and pt to continue medical treatment as before,  to f/u any worsening symptoms or concerns Lab Results  Component Value Date   LDLCALC 73 10/27/2016

## 2016-10-30 NOTE — Assessment & Plan Note (Signed)

## 2016-10-30 NOTE — Assessment & Plan Note (Signed)
Mild to mod, c/w bronchitis vs pna, for antibx course,  to f/u any worsening symptoms or concerns 

## 2016-10-30 NOTE — Assessment & Plan Note (Addendum)
Mild to mod c/w bronchospasm infectious related, for predpac asd, cough med and albuterol MDI,  to f/u any worsening symptoms or concerns

## 2016-11-13 ENCOUNTER — Other Ambulatory Visit: Payer: Self-pay | Admitting: Internal Medicine

## 2016-11-15 ENCOUNTER — Encounter: Payer: Self-pay | Admitting: Gastroenterology

## 2016-11-15 ENCOUNTER — Ambulatory Visit (AMBULATORY_SURGERY_CENTER): Payer: BLUE CROSS/BLUE SHIELD | Admitting: Gastroenterology

## 2016-11-15 VITALS — BP 103/64 | HR 66 | Temp 99.1°F | Resp 17 | Ht 72.0 in | Wt 217.0 lb

## 2016-11-15 DIAGNOSIS — Z1211 Encounter for screening for malignant neoplasm of colon: Secondary | ICD-10-CM

## 2016-11-15 DIAGNOSIS — Z1212 Encounter for screening for malignant neoplasm of rectum: Secondary | ICD-10-CM | POA: Diagnosis not present

## 2016-11-15 MED ORDER — SODIUM CHLORIDE 0.9 % IV SOLN: 500.0000 mL | INTRAVENOUS | Status: DC

## 2016-11-15 NOTE — Patient Instructions (Signed)

## 2016-11-15 NOTE — Progress Notes (Signed)
A and O x3. Report to RN. Tolerated MAC anesthesia well.

## 2016-11-15 NOTE — Op Note (Signed)
[facility] Patient Name: Jimmy Hess Procedure Date: 11/15/2016 1:21 PM MRN: [id number] Endoscopist: Viviann Spare P. Kenneth Cuaresma MD, MD Age: 51 Referring MD:  Date of Birth: 1966/06/24 Gender: Male Account #: [id number] Procedure:                Colonoscopy Indications:              Screening for colorectal malignant neoplasm, This                            is the patient's first colonoscopy Medicines:                Monitored Anesthesia Care Procedure:                Pre-Anesthesia Assessment:                           - Prior to the procedure, a History and Physical                            was performed, and patient medications and                            allergies were reviewed. The patient's tolerance of                            previous anesthesia was also reviewed. The risks                            and benefits of the procedure and the sedation                            options and risks were discussed with the patient.                            All questions were answered, and informed consent                            was obtained. Prior Anticoagulants: The patient has                            taken no previous anticoagulant or antiplatelet                            agents. ASA Grade Assessment: II - A patient with                            mild systemic disease. After reviewing the risks                            and benefits, the patient was deemed in                            satisfactory condition to undergo the procedure.  After obtaining informed consent, the colonoscope                            was passed under direct vision. Throughout the                            procedure, the patient's blood pressure, pulse, and                            oxygen saturations were monitored continuously. The                            Colonoscope was introduced through the anus and                            advanced to the the  cecum, identified by                            appendiceal orifice and ileocecal valve. The                            colonoscopy was performed without difficulty. The                            patient tolerated the procedure well. The quality                            of the bowel preparation was good. The ileocecal                            valve, appendiceal orifice, and rectum were                            photographed. Scope In: 1:26:28 PM Scope Out: 1:43:59 PM Scope Withdrawal Time: 0 hours 14 minutes 49 seconds  Total Procedure Duration: 0 hours 17 minutes 31 seconds  Findings:                 The perianal and digital rectal examinations were                            normal.                           The entire examined colon appeared normal on direct                            and retroflexion views. No polyps. Complications:            No immediate complications. Estimated blood loss:                            None. Estimated Blood Loss:     Estimated blood loss: none. Impression:               - The entire examined colon is normal on direct and  retroflexion views. No polyps. Recommendation:           - Patient has a contact number available for                            emergencies. The signs and symptoms of potential                            delayed complications were discussed with the                            patient. Return to normal activities tomorrow.                            Written discharge instructions were provided to the                            patient.                           - Resume previous diet.                           - Continue present medications.                           - Repeat colonoscopy in 10 years for screening                            purposes. Viviann Spare P. Governor Matos MD, MD 11/15/2016 1:47:50 PM This report has been signed electronically.

## 2016-11-16 ENCOUNTER — Telehealth: Payer: Self-pay

## 2016-11-16 ENCOUNTER — Telehealth: Payer: Self-pay | Admitting: *Deleted

## 2016-11-16 NOTE — Telephone Encounter (Signed)
  Follow up Call-  Call back number 11/15/2016  Post procedure Call Back phone  # [phone]  Permission to leave phone message Yes  Some recent data might be hidden   [facility]

## 2016-11-16 NOTE — Telephone Encounter (Signed)
  Follow up Call-  Call back number 11/15/2016  Post procedure Call Back phone  # [phone]  Permission to leave phone message Yes  Some recent data might be hidden   left message

## 2017-10-31 ENCOUNTER — Encounter: Payer: BLUE CROSS/BLUE SHIELD | Admitting: Internal Medicine

## 2017-10-31 ENCOUNTER — Encounter: Payer: 59 | Admitting: Internal Medicine

## 2017-10-31 DIAGNOSIS — Z0289 Encounter for other administrative examinations: Secondary | ICD-10-CM

## 2017-11-18 ENCOUNTER — Other Ambulatory Visit: Payer: Self-pay | Admitting: Internal Medicine

## 2017-11-30 ENCOUNTER — Telehealth: Payer: Self-pay | Admitting: Internal Medicine

## 2017-11-30 MED ORDER — TELMISARTAN 40 MG PO TABS: 40.0000 mg | ORAL_TABLET | Freq: Every day | ORAL | 0 refills | Status: DC

## 2017-11-30 NOTE — Telephone Encounter (Signed)
Copied from CRM [id number]. Topic: Quick Communication - See Telephone Encounter >> Nov 30, 2017  1:10 PM Windy KalataMichael, Danett Palazzo L, NT wrote: CRM for notification. See Telephone encounter for: 11/30/17.  Patient is calling and requesting a refill on telmisartan (MICARDIS) 40 MG tablet. Patient states it was denied due to needing a appt. Patient scheduled a physical for 12/05/17 and would like to know can he get enough to last him. Please advise.  [id number]- [place], [place] - [address]  [address] [place] [id number]  Phone: [phone] Fax: [phone]

## 2017-11-30 NOTE — Telephone Encounter (Signed)
Per office policy sent 30 day to local pharmacy until appt.../lmb  

## 2017-12-05 ENCOUNTER — Other Ambulatory Visit (INDEPENDENT_AMBULATORY_CARE_PROVIDER_SITE_OTHER): Payer: BLUE CROSS/BLUE SHIELD

## 2017-12-05 ENCOUNTER — Encounter: Payer: Self-pay | Admitting: Internal Medicine

## 2017-12-05 ENCOUNTER — Ambulatory Visit (INDEPENDENT_AMBULATORY_CARE_PROVIDER_SITE_OTHER): Payer: BLUE CROSS/BLUE SHIELD | Admitting: Internal Medicine

## 2017-12-05 VITALS — BP 120/82 | HR 80 | Temp 98.9°F | Ht 72.0 in | Wt 219.0 lb

## 2017-12-05 DIAGNOSIS — I10 Essential (primary) hypertension: Secondary | ICD-10-CM

## 2017-12-05 DIAGNOSIS — Z0000 Encounter for general adult medical examination without abnormal findings: Secondary | ICD-10-CM | POA: Diagnosis not present

## 2017-12-05 LAB — CBC WITH DIFFERENTIAL/PLATELET
BASOS PCT: 0.5 % (ref 0.0–3.0)
Basophils Absolute: 0 10*3/uL (ref 0.0–0.1)
EOS PCT: 0.5 % (ref 0.0–5.0)
Eosinophils Absolute: 0 10*3/uL (ref 0.0–0.7)
HEMATOCRIT: 42.7 % (ref 39.0–52.0)
HEMOGLOBIN: 14.3 g/dL (ref 13.0–17.0)
Lymphocytes Relative: 29.7 % (ref 12.0–46.0)
Lymphs Abs: 0.8 10*3/uL (ref 0.7–4.0)
MCHC: 33.4 g/dL (ref 30.0–36.0)
MCV: 93.5 fl (ref 78.0–100.0)
Monocytes Absolute: 0.3 10*3/uL (ref 0.1–1.0)
Monocytes Relative: 10.3 % (ref 3.0–12.0)
Neutro Abs: 1.6 10*3/uL (ref 1.4–7.7)
Neutrophils Relative %: 59 % (ref 43.0–77.0)
Platelets: 158 10*3/uL (ref 150.0–400.0)
RBC: 4.57 Mil/uL (ref 4.22–5.81)
RDW: 12.1 % (ref 11.5–15.5)
WBC: 2.8 10*3/uL — AB (ref 4.0–10.5)

## 2017-12-05 LAB — BASIC METABOLIC PANEL
BUN: 26 mg/dL — AB (ref 6–23)
CALCIUM: 9.6 mg/dL (ref 8.4–10.5)
CO2: 31 mEq/L (ref 19–32)
Chloride: 102 mEq/L (ref 96–112)
Creatinine, Ser: 1.12 mg/dL (ref 0.40–1.50)
GFR: 88.53 mL/min (ref >60.00)
Glucose, Bld: 91 mg/dL (ref 70–99)
POTASSIUM: 4.3 meq/L (ref 3.5–5.1)
SODIUM: 139 meq/L (ref 135–145)

## 2017-12-05 LAB — HEPATIC FUNCTION PANEL
ALT: 23 U/L (ref 0–53)
AST: 26 U/L (ref 0–37)
Albumin: 4.5 g/dL (ref 3.5–5.2)
Alkaline Phosphatase: 47 U/L (ref 39–117)
BILIRUBIN DIRECT: 0.1 mg/dL (ref 0.0–0.3)
Total Bilirubin: 0.7 mg/dL (ref 0.2–1.2)
Total Protein: 7.1 g/dL (ref 6.0–8.3)

## 2017-12-05 LAB — LIPID PANEL
CHOLESTEROL: 117 mg/dL (ref 0–200)
HDL: 45 mg/dL (ref >39.00)
LDL Cholesterol: 63 mg/dL (ref 0–99)
NONHDL: 72.11
Total CHOL/HDL Ratio: 3
Triglycerides: 45 mg/dL (ref 0.0–149.0)
VLDL: 9 mg/dL (ref 0.0–40.0)

## 2017-12-05 LAB — URINALYSIS, ROUTINE W REFLEX MICROSCOPIC
Bilirubin Urine: NEGATIVE
Hgb urine dipstick: NEGATIVE
Ketones, ur: NEGATIVE
Leukocytes, UA: NEGATIVE
Nitrite: NEGATIVE
PH: 6.5 (ref 5.0–8.0)
RBC / HPF: NONE SEEN (ref 0–?)
SPECIFIC GRAVITY, URINE: 1.015 (ref 1.000–1.030)
Total Protein, Urine: NEGATIVE
UROBILINOGEN UA: 0.2 (ref 0.0–1.0)
Urine Glucose: NEGATIVE
WBC, UA: NONE SEEN (ref 0–?)

## 2017-12-05 LAB — TSH: TSH: 1.16 u[IU]/mL (ref 0.35–4.50)

## 2017-12-05 LAB — PSA: PSA: 0.43 ng/mL (ref 0.10–4.00)

## 2017-12-05 MED ORDER — MELOXICAM 15 MG PO TABS: 15.0000 mg | ORAL_TABLET | Freq: Every day | ORAL | 11 refills | Status: DC

## 2017-12-05 MED ORDER — TELMISARTAN 40 MG PO TABS: 40.0000 mg | ORAL_TABLET | Freq: Every day | ORAL | 11 refills | Status: DC

## 2017-12-05 MED ORDER — TELMISARTAN 40 MG PO TABS: 40.0000 mg | ORAL_TABLET | Freq: Every day | ORAL | 3 refills | Status: DC

## 2017-12-05 MED ORDER — ALBUTEROL SULFATE HFA 108 (90 BASE) MCG/ACT IN AERS: 2.0000 | INHALATION_SPRAY | Freq: Four times a day (QID) | RESPIRATORY_TRACT | 5 refills | Status: DC | PRN

## 2017-12-05 NOTE — Progress Notes (Signed)
Subjective:    Patient ID: Jimmy Hess, male    DOB: October 03, 1965, 52 y.o.   MRN: [id number]  HPI  Here for wellness and f/u;  Overall doing ok;  Pt denies Chest pain, worsening SOB, DOE, wheezing, orthopnea, PND, worsening LE edema, palpitations, dizziness or syncope.  Pt denies neurological change such as new headache, facial or extremity weakness.  Pt denies polydipsia, polyuria, or low sugar symptoms. Pt states overall good compliance with treatment and medications, good tolerability, and has been trying to follow appropriate diet.  Pt denies worsening depressive symptoms, suicidal ideation or panic. No fever, night sweats, wt loss, loss of appetite, or other constitutional symptoms.  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  Had to leave a part time position at [employer] due to back pain and carpal tunnel  No other interval hx or new complaints Wt Readings from Last 3 Encounters:  12/05/17 219 lb (99.3 kg)  11/15/16 217 lb (98.4 kg)  10/27/16 217 lb (98.4 kg)   BP Readings from Last 3 Encounters:  12/05/17 120/82  11/15/16 103/64  10/27/16 118/80   Past Medical History:  Diagnosis Date  . ALLERGIC RHINITIS 05/16/2007   Qualifier: Diagnosis of  By: Jonny Ruiz MD, Len Blalock   . Allergy   . Arthritis   . ERECTILE DYSFUNCTION 05/12/2007   Qualifier: Diagnosis of  By: Jonny Ruiz MD, Len Blalock   . GERD 05/16/2007   Qualifier: Diagnosis of  By: Jonny Ruiz MD, Len Blalock   . Glucose 6 phosphatase deficiency (HCC)   . GLUCOSE-6-PHOSPHATE DEHYDROGENASE DEFICIENCY 05/12/2007   Qualifier: Diagnosis of  By: Maris Berger   . HYPERLIPIDEMIA 09/10/2008   Qualifier: Diagnosis of  By: Jonny Ruiz MD, Len Blalock   . HYPERTENSION 05/16/2007   Qualifier: Diagnosis of  By: Jonny Ruiz MD, Len Blalock; HTN meds discontinued by provider   Past Surgical History:    Procedure Laterality Date  . FRACTURE SURGERY     ankle repair  . KNEE SURGERY    . NASAL SEPTUM SURGERY      reports that he has quit smoking. He has never used smokeless tobacco. He reports that he drinks alcohol. He reports that he does not use drugs. family history includes Heart failure in his maternal grandmother; Hypertension in his maternal grandmother and mother; Prostate cancer in his father. Allergies  Allergen Reactions  . Aspirin Other (See Comments)    Cannot have due to blood deficiency  . Sulfonamide Derivatives Other (See Comments)    Patient unsure   Current Outpatient Medications on File Prior to Visit  Medication Sig Dispense Refill  . Amino Acids (AMINO ACID PO) Take 1 tablet by mouth daily.    . Multiple Vitamin (MULTIVITAMIN) tablet Take 1 tablet by mouth daily.     No current facility-administered medications on file prior to visit.    Review of Systems Constitutional: Negative for other unusual diaphoresis, sweats, appetite or weight changes HENT: Negative for other worsening hearing loss, ear pain, facial swelling, mouth sores or neck stiffness.   Eyes: Negative for other worsening pain, redness or other visual disturbance.  Respiratory: Negative for other stridor or swelling Cardiovascular: Negative for other palpitations or other chest pain  Gastrointestinal: Negative for worsening diarrhea or loose stools, blood in stool, distention or other pain Genitourinary: Negative  for hematuria, flank pain or other change in urine volume.  Musculoskeletal: Negative for myalgias or other joint swelling.  Skin: Negative for other color change, or other wound or worsening drainage.  Neurological: Negative for other syncope or numbness. Hematological: Negative for other adenopathy or swelling Psychiatric/Behavioral: Negative for hallucinations, other worsening agitation, SI, self-injury, or new decreased concentration All other system neg per pt    Objective:    Physical Exam BP 120/82   Pulse 80   Temp 98.9 F (37.2 C) (Oral)   Ht 6' (1.829 m)   Wt 219 lb (99.3 kg)   SpO2 98%   BMI 29.70 kg/m  VS noted,  Constitutional: Pt is oriented to person, place, and time. Appears well-developed and well-nourished, in no significant distress and comfortable Head: Normocephalic and atraumatic  Eyes: Conjunctivae and EOM are normal. Pupils are equal, round, and reactive to light Right Ear: External ear normal without discharge Left Ear: External ear normal without discharge Nose: Nose without discharge or deformity Mouth/Throat: Oropharynx is without other ulcerations and moist  Neck: Normal range of motion. Neck supple. No JVD present. No tracheal deviation present or significant neck LA or mass Cardiovascular: Normal rate, regular rhythm, normal heart sounds and intact distal pulses. Pulmonary/Chest: WOB normal and breath sounds without rales or wheezing  Abdominal: Soft. Bowel sounds are normal. NT. No HSM  Musculoskeletal: Normal range of motion. Exhibits no edema Lymphadenopathy: Has no other cervical adenopathy.  Neurological: Pt is alert and oriented to person, place, and time. Pt has normal reflexes. No cranial nerve deficit. Motor grossly intact, Gait intact Skin: Skin is warm and dry. No rash noted or new ulcerations Psychiatric:  Has normal mood and affect. Behavior is normal without agitation No other exam findings  Lab Results  Component Value Date   WBC 3.4 (L) 10/27/2016   HGB 13.5 10/27/2016   HCT 39.4 10/27/2016   PLT 154.0 10/27/2016   GLUCOSE 69 (L) 10/27/2016   CHOL 137 10/27/2016   TRIG 88.0 10/27/2016   HDL 46.10 10/27/2016   LDLCALC 73 10/27/2016   ALT 22 10/27/2016   AST 30 10/27/2016   NA 140 10/27/2016   K 4.2 10/27/2016   CL 102 10/27/2016   CREATININE 1.17 10/27/2016   BUN 18 10/27/2016   CO2 33 (H) 10/27/2016   TSH 0.80 10/27/2016   PSA 0.54 10/27/2016       Assessment & Plan:

## 2017-12-05 NOTE — Patient Instructions (Signed)

## 2017-12-05 NOTE — Assessment & Plan Note (Signed)

## 2017-12-05 NOTE — Assessment & Plan Note (Signed)
stable overall by history and exam, recent data reviewed with pt, and pt to continue medical treatment as before,  to f/u any worsening symptoms or concerns BP Readings from Last 3 Encounters:  12/05/17 120/82  11/15/16 103/64  10/27/16 118/80

## 2018-09-03 ENCOUNTER — Ambulatory Visit: Payer: BLUE CROSS/BLUE SHIELD | Admitting: Internal Medicine

## 2018-12-06 ENCOUNTER — Encounter: Payer: BLUE CROSS/BLUE SHIELD | Admitting: Internal Medicine

## 2019-01-09 ENCOUNTER — Other Ambulatory Visit: Payer: Self-pay | Admitting: Internal Medicine

## 2019-01-25 ENCOUNTER — Other Ambulatory Visit: Payer: Self-pay

## 2019-01-25 ENCOUNTER — Encounter: Payer: BLUE CROSS/BLUE SHIELD | Admitting: Internal Medicine

## 2019-01-25 ENCOUNTER — Ambulatory Visit (INDEPENDENT_AMBULATORY_CARE_PROVIDER_SITE_OTHER): Payer: 59 | Admitting: Family Medicine

## 2019-01-25 ENCOUNTER — Encounter: Payer: Self-pay | Admitting: Family Medicine

## 2019-01-25 VITALS — BP 132/84 | HR 86 | Resp 16

## 2019-01-25 DIAGNOSIS — M545 Low back pain, unspecified: Secondary | ICD-10-CM

## 2019-01-25 DIAGNOSIS — M25561 Pain in right knee: Secondary | ICD-10-CM | POA: Diagnosis not present

## 2019-01-25 MED ORDER — DICLOFENAC SODIUM 2 % TD SOLN: 1.0000 "application " | Freq: Two times a day (BID) | TRANSDERMAL | 3 refills | Status: DC

## 2019-01-25 NOTE — Progress Notes (Signed)
Jimmy Hess - 53 y.o. male MRN [id number]  Date of birth: 08/07/1966  SUBJECTIVE:  Including CC & ROS.  No chief complaint on file.   Jimmy Hess is a 53 y.o. male that is presenting with right knee pain and low back pain.  The right knee pain is acute on chronic in nature.  Has worsened over the past couple of months.  As of late he has been off work and the pain is improved.  He did have some swelling at one point.  Has taken anti-inflammatories with some improvement.  Denies any mechanical symptoms.  Has a history of meniscus tear sometime ago.  Denies any prior surgeries.  Has rehabbed it when he previously injured it.  The pain is over the medial aspect of the knee.  Has been playing more golf recently and has developed some bilateral lower back pain.  He feels like there is a popping sensation.  Denies any radicular symptoms.  The popping sensation occurs when he is making a rotation.  Denies any significant pain.  The pain can be sharp in nature.  Pain is intermittent in occurrence..  Independent review of the right knee x-ray from 10/17/2013 shows mild medial joint space narrowing.   Review of Systems  Constitutional: Negative for fever.  HENT: Negative for congestion.   Respiratory: Negative for cough.   Cardiovascular: Negative for chest pain.  Gastrointestinal: Negative for abdominal pain.  Musculoskeletal: Positive for joint swelling.  Skin: Negative for color change.  Neurological: Negative for weakness.  Hematological: Negative for adenopathy.    HISTORY: Past Medical, Surgical, Social, and Family History Reviewed & Updated per EMR.   Pertinent Historical Findings include:  Past Medical History:  Diagnosis Date  . ALLERGIC RHINITIS 05/16/2007   Qualifier: Diagnosis of  By: Jonny Ruiz MD, Len Blalock   . Allergy   . Arthritis   . ERECTILE DYSFUNCTION 05/12/2007   Qualifier: Diagnosis of  By: Jonny Ruiz MD, Len Blalock   . GERD 05/16/2007   Qualifier: Diagnosis of  By: Jonny Ruiz MD, Len Blalock   . Glucose 6 phosphatase deficiency (HCC)   . GLUCOSE-6-PHOSPHATE DEHYDROGENASE DEFICIENCY 05/12/2007   Qualifier: Diagnosis of  By: Maris Berger   . HYPERLIPIDEMIA 09/10/2008   Qualifier: Diagnosis of  By: Jonny Ruiz MD, Len Blalock   . HYPERTENSION 05/16/2007   Qualifier: Diagnosis of  By: Jonny Ruiz MD, Len Blalock; HTN meds discontinued by provider    Past Surgical History:  Procedure Laterality Date  . FRACTURE SURGERY     ankle repair  . KNEE SURGERY    . NASAL SEPTUM SURGERY      Allergies  Allergen Reactions  . Aspirin Other (See Comments)    Cannot have due to blood deficiency  . Sulfonamide Derivatives Other (See Comments)    Patient unsure    Family History  Problem Relation Age of Onset  . Hypertension Mother   . Prostate cancer Father   . Heart failure Maternal Grandmother   . Hypertension Maternal Grandmother   . Colon cancer Neg Hx      Social History   Socioeconomic History  . Marital status: Divorced    Spouse name: Not on file  . Number of children: Not on file  . Years of education: Not on file  . Highest education level: Not on file  Occupational History  . Not on file  Social Needs  . Financial resource strain: Not on file  . Food insecurity:    Worry:  Not on file    Inability: Not on file  . Transportation needs:    Medical: Not on file    Non-medical: Not on file  Tobacco Use  . Smoking status: Former Games developer  . Smokeless tobacco: Never Used  Substance and Sexual Activity  . Alcohol use: Yes    Comment: occasionally  . Drug use: No  . Sexual activity: Not on file  Lifestyle  . Physical activity:    Days per week: Not on file    Minutes per session: Not on file  . Stress: Not on file  Relationships  . Social connections:    Talks on phone: Not on file    Gets together: Not on file    Attends religious service: Not on file    Active member of club or organization: Not on file    Attends meetings of clubs or organizations: Not on file     Relationship status: Not on file  . Intimate partner violence:    Fear of current or ex partner: Not on file    Emotionally abused: Not on file    Physically abused: Not on file    Forced sexual activity: Not on file  Other Topics Concern  . Not on file  Social History Narrative  . Not on file     PHYSICAL EXAM:  VS: BP 132/84   Pulse 86   Resp 16   SpO2 96%  Physical Exam Gen: NAD, alert, cooperative with exam, well-appearing ENT: normal lips, normal nasal mucosa,  Eye: normal EOM, normal conjunctiva and lids CV:  no edema, +2 pedal pulses   Resp: no accessory muscle use, non-labored,  Skin: no rashes, no areas of induration  Neuro: normal tone, normal sensation to touch Psych:  normal insight, alert and oriented MSK:  Right knee: No effusion today. No significant tenderness palpation of the quad tendon or patellar tendon. No significant tenderness palpation of the medial joint line. Normal extension.  Some limitations with flexion. No instability with valgus or varus stress testing. No pain with patellar grind and McMurray's test. Back: No tenderness palpation of the SI joints. Tenderness palpation over the paraspinal muscles in the left and right. Negative straight leg raise bilaterally. Normal strength resistance with hip flexion, No tenderness palpation of the greater trochanter Neurovascular intact     ASSESSMENT & PLAN:   Acute bilateral low back pain without sciatica This seems to be more muscular in nature.  It does not appear to be the SI joints.  I think this is related to playing golf and rotating more with the upper body as opposed to the lower body.  Improving his mobility and stability would likely improve this pain. -Counseled on home exercise therapy and supportive care. -If no improvement will consider imaging and physical therapy.  Acute pain of right knee Likely has component of degenerative changes that are contributing to the symptoms  today.  An acute flare.  No significant effusion today however. -Pennsaid. -Counseled on home exercise therapy and supportive care. -Counseled on body helix compression. -Could consider a medial unloader brace due to his thigh to calf ratio. -If no improvement can consider imaging and injection.

## 2019-01-25 NOTE — Patient Instructions (Signed)
Nice to meet you  Please try the exercises  Please try the rub on medicine  Please try the compression or the knee brace Please send me a message in MyChart with any questions or updates.  Please see me back in 4 weeks.   --Dr. Jordan Likes

## 2019-01-25 NOTE — Assessment & Plan Note (Signed)
This seems to be more muscular in nature.  It does not appear to be the SI joints.  I think this is related to playing golf and rotating more with the upper body as opposed to the lower body.  Improving his mobility and stability would likely improve this pain. -Counseled on home exercise therapy and supportive care. -If no improvement will consider imaging and physical therapy.

## 2019-01-25 NOTE — Assessment & Plan Note (Signed)
Likely has component of degenerative changes that are contributing to the symptoms today.  An acute flare.  No significant effusion today however. -Pennsaid. -Counseled on home exercise therapy and supportive care. -Counseled on body helix compression. -Could consider a medial unloader brace due to his thigh to calf ratio. -If no improvement can consider imaging and injection.

## 2019-02-02 ENCOUNTER — Other Ambulatory Visit: Payer: Self-pay | Admitting: Internal Medicine

## 2019-03-15 ENCOUNTER — Other Ambulatory Visit (INDEPENDENT_AMBULATORY_CARE_PROVIDER_SITE_OTHER): Payer: 59

## 2019-03-15 ENCOUNTER — Ambulatory Visit (INDEPENDENT_AMBULATORY_CARE_PROVIDER_SITE_OTHER): Payer: 59 | Admitting: Internal Medicine

## 2019-03-15 ENCOUNTER — Other Ambulatory Visit: Payer: Self-pay

## 2019-03-15 ENCOUNTER — Encounter: Payer: Self-pay | Admitting: Internal Medicine

## 2019-03-15 VITALS — BP 122/78 | HR 60 | Temp 98.5°F | Ht 72.0 in | Wt 222.0 lb

## 2019-03-15 DIAGNOSIS — E559 Vitamin D deficiency, unspecified: Secondary | ICD-10-CM

## 2019-03-15 DIAGNOSIS — Z0000 Encounter for general adult medical examination without abnormal findings: Secondary | ICD-10-CM | POA: Diagnosis not present

## 2019-03-15 DIAGNOSIS — E611 Iron deficiency: Secondary | ICD-10-CM

## 2019-03-15 DIAGNOSIS — E538 Deficiency of other specified B group vitamins: Secondary | ICD-10-CM

## 2019-03-15 DIAGNOSIS — Z0001 Encounter for general adult medical examination with abnormal findings: Secondary | ICD-10-CM | POA: Diagnosis not present

## 2019-03-15 DIAGNOSIS — M25461 Effusion, right knee: Secondary | ICD-10-CM | POA: Diagnosis not present

## 2019-03-15 DIAGNOSIS — K219 Gastro-esophageal reflux disease without esophagitis: Secondary | ICD-10-CM | POA: Diagnosis not present

## 2019-03-15 DIAGNOSIS — E785 Hyperlipidemia, unspecified: Secondary | ICD-10-CM | POA: Diagnosis not present

## 2019-03-15 DIAGNOSIS — I10 Essential (primary) hypertension: Secondary | ICD-10-CM

## 2019-03-15 LAB — BASIC METABOLIC PANEL
BUN: 15 mg/dL (ref 6–23)
CO2: 29 mEq/L (ref 19–32)
Calcium: 9.2 mg/dL (ref 8.4–10.5)
Chloride: 105 mEq/L (ref 96–112)
Creatinine, Ser: 1.08 mg/dL (ref 0.40–1.50)
GFR: 86.44 mL/min (ref >60.00)
Glucose, Bld: 92 mg/dL (ref 70–99)
Potassium: 4.3 mEq/L (ref 3.5–5.1)
Sodium: 140 mEq/L (ref 135–145)

## 2019-03-15 LAB — CBC WITH DIFFERENTIAL/PLATELET
Basophils Absolute: 0 10*3/uL (ref 0.0–0.1)
Basophils Relative: 0.7 % (ref 0.0–3.0)
Eosinophils Absolute: 0 10*3/uL (ref 0.0–0.7)
Eosinophils Relative: 1.3 % (ref 0.0–5.0)
HCT: 42.4 % (ref 39.0–52.0)
Hemoglobin: 14.1 g/dL (ref 13.0–17.0)
Lymphocytes Relative: 39.2 % (ref 12.0–46.0)
Lymphs Abs: 1.2 10*3/uL (ref 0.7–4.0)
MCHC: 33.3 g/dL (ref 30.0–36.0)
MCV: 96.1 fl (ref 78.0–100.0)
Monocytes Absolute: 0.3 10*3/uL (ref 0.1–1.0)
Monocytes Relative: 10.1 % (ref 3.0–12.0)
Neutro Abs: 1.5 10*3/uL (ref 1.4–7.7)
Neutrophils Relative %: 48.7 % (ref 43.0–77.0)
Platelets: 139 10*3/uL — ABNORMAL LOW (ref 150.0–400.0)
RBC: 4.41 Mil/uL (ref 4.22–5.81)
RDW: 12.4 % (ref 11.5–15.5)
WBC: 3.1 10*3/uL — ABNORMAL LOW (ref 4.0–10.5)

## 2019-03-15 LAB — HEPATIC FUNCTION PANEL
ALT: 12 U/L (ref 0–53)
AST: 17 U/L (ref 0–37)
Albumin: 4.7 g/dL (ref 3.5–5.2)
Alkaline Phosphatase: 58 U/L (ref 39–117)
Bilirubin, Direct: 0.1 mg/dL (ref 0.0–0.3)
Total Bilirubin: 0.9 mg/dL (ref 0.2–1.2)
Total Protein: 7 g/dL (ref 6.0–8.3)

## 2019-03-15 LAB — VITAMIN D 25 HYDROXY (VIT D DEFICIENCY, FRACTURES): VITD: 17.4 ng/mL — ABNORMAL LOW (ref 30.00–100.00)

## 2019-03-15 LAB — URINALYSIS, ROUTINE W REFLEX MICROSCOPIC
Bilirubin Urine: NEGATIVE
Hgb urine dipstick: NEGATIVE
Ketones, ur: NEGATIVE
Leukocytes,Ua: NEGATIVE
Nitrite: NEGATIVE
RBC / HPF: NONE SEEN (ref 0–?)
Specific Gravity, Urine: 1.025 (ref 1.000–1.030)
Total Protein, Urine: NEGATIVE
Urine Glucose: NEGATIVE
Urobilinogen, UA: 0.2 (ref 0.0–1.0)
pH: 6 (ref 5.0–8.0)

## 2019-03-15 LAB — IBC PANEL
Iron: 88 ug/dL (ref 42–165)
Saturation Ratios: 25.3 % (ref 20.0–50.0)
Transferrin: 248 mg/dL (ref 212.0–360.0)

## 2019-03-15 LAB — LIPID PANEL
Cholesterol: 174 mg/dL (ref 0–200)
HDL: 58.9 mg/dL (ref >39.00)
LDL Cholesterol: 101 mg/dL — ABNORMAL HIGH (ref 0–99)
NonHDL: 115.29
Total CHOL/HDL Ratio: 3
Triglycerides: 71 mg/dL (ref 0.0–149.0)
VLDL: 14.2 mg/dL (ref 0.0–40.0)

## 2019-03-15 LAB — VITAMIN B12: Vitamin B-12: 981 pg/mL — ABNORMAL HIGH (ref 211–911)

## 2019-03-15 LAB — TSH: TSH: 0.68 u[IU]/mL (ref 0.35–4.50)

## 2019-03-15 LAB — PSA: PSA: 0.49 ng/mL (ref 0.10–4.00)

## 2019-03-15 MED ORDER — PENNSAID 2 % TD SOLN: 1.0000 "application " | Freq: Two times a day (BID) | TRANSDERMAL | 5 refills | Status: DC

## 2019-03-15 NOTE — Patient Instructions (Addendum)
You had the Tdap tetanus shot today  You will be contacted regarding the referral for: Sports Medicine  Please continue all other medications as before, and refills have been done if requested.  Please have the pharmacy call with any other refills you may need.  Please continue your efforts at being more active, low cholesterol diet, and weight control.  You are otherwise up to date with prevention measures today.  Please keep your appointments with your specialists as you may have planned  Please go to the LAB in the Basement (turn left off the elevator) for the tests to be done today  You will be contacted by phone if any changes need to be made immediately.  Otherwise, you will receive a letter about your results with an explanation, but please check with MyChart first.  Please remember to sign up for MyChart if you have not done so, as this will be important to you in the future with finding out test results, communicating by private email, and scheduling acute appointments online when needed.  Please return in 1 year for your yearly visit, or sooner if needed, with Lab testing done 3-5 days before

## 2019-03-15 NOTE — Progress Notes (Signed)
Subjective:    Patient ID: Jimmy Hess, male    DOB: April 27, 1966, 53 y.o.   MRN: [id number]  HPI  Here for wellness and f/u;  Overall doing ok;  Pt denies Chest pain, worsening SOB, DOE, wheezing, orthopnea, PND, worsening LE edema, palpitations, dizziness or syncope.  Pt denies neurological change such as new headache, facial or extremity weakness.  Pt denies polydipsia, polyuria, or low sugar symptoms. Pt states overall good compliance with treatment and medications, good tolerability, and has been trying to follow appropriate diet.  Pt denies worsening depressive symptoms, suicidal ideation or panic. No fever, night sweats, wt loss, loss of appetite, or other constitutional symptoms.  Pt states good ability with ADL's, has low fall risk, home safety reviewed and adequate, no other significant changes in hearing or vision, and only occasionally active with exercise. Also c/o right knee pain and effusion intermittent mild to mod constant since onset 3 wks ago after doing jump rope for exercise.  Has been less training in body building, trying to do more cardio.  Standing and walking makes worse, rest and sitting improves.  Denies worsening reflux, abd pain, dysphagia, n/v, bowel change or blood. Past Medical History:  Diagnosis Date  . ALLERGIC RHINITIS 05/16/2007   Qualifier: Diagnosis of  By: Jonny Ruiz MD, Len Blalock   . Allergy   . Arthritis   . ERECTILE DYSFUNCTION 05/12/2007   Qualifier: Diagnosis of  By: Jonny Ruiz MD, Len Blalock   . GERD 05/16/2007   Qualifier: Diagnosis of  By: Jonny Ruiz MD, Len Blalock   . Glucose 6 phosphatase deficiency (HCC)   . GLUCOSE-6-PHOSPHATE DEHYDROGENASE DEFICIENCY 05/12/2007   Qualifier: Diagnosis of  By: Maris Berger   . HYPERLIPIDEMIA 09/10/2008   Qualifier: Diagnosis of  By: Jonny Ruiz MD, Len Blalock   . HYPERTENSION 05/16/2007   Qualifier: Diagnosis of  By: Jonny Ruiz MD, Len Blalock; HTN meds discontinued by provider   Past Surgical History:  Procedure Laterality Date  . FRACTURE  SURGERY     ankle repair  . KNEE SURGERY    . NASAL SEPTUM SURGERY      reports that he has quit smoking. He has never used smokeless tobacco. He reports current alcohol use. He reports that he does not use drugs. family history includes Heart failure in his maternal grandmother; Hypertension in his maternal grandmother and mother; Prostate cancer in his father. Allergies  Allergen Reactions  . Aspirin Other (See Comments)    Cannot have due to blood deficiency  . Sulfonamide Derivatives Other (See Comments)    Patient unsure   Current Outpatient Medications on File Prior to Visit  Medication Sig Dispense Refill  . albuterol (PROVENTIL HFA;VENTOLIN HFA) 108 (90 Base) MCG/ACT inhaler Inhale 2 puffs into the lungs every 6 (six) hours as needed for wheezing or shortness of breath. 1 Inhaler 5  . Amino Acids (AMINO ACID PO) Take 1 tablet by mouth daily.    . meloxicam (MOBIC) 15 MG tablet Take 1 tablet (15 mg total) by mouth daily. As needed for pain 30 tablet 11  . Multiple Vitamin (MULTIVITAMIN) tablet Take 1 tablet by mouth daily.    Marland Kitchen telmisartan (MICARDIS) 40 MG tablet TAKE 1 TABLET BY MOUTH EVERY DAY 30 tablet 2   No current facility-administered medications on file prior to visit.    Review of Systems Constitutional: Negative for other unusual diaphoresis, sweats, appetite or weight changes HENT: Negative for other worsening hearing loss, ear pain, facial swelling, mouth sores or  neck stiffness.   Eyes: Negative for other worsening pain, redness or other visual disturbance.  Respiratory: Negative for other stridor or swelling Cardiovascular: Negative for other palpitations or other chest pain  Gastrointestinal: Negative for worsening diarrhea or loose stools, blood in stool, distention or other pain Genitourinary: Negative for hematuria, flank pain or other change in urine volume.  Musculoskeletal: Negative for myalgias or other joint swelling.  Skin: Negative for other color  change, or other wound or worsening drainage.  Neurological: Negative for other syncope or numbness. Hematological: Negative for other adenopathy or swelling Psychiatric/Behavioral: Negative for hallucinations, other worsening agitation, SI, self-injury, or new decreased concentration All other system neg per pt    Objective:   Physical Exam BP 122/78   Pulse 60   Temp 98.5 F (36.9 C) (Oral)   Ht 6' (1.829 m)   Wt 222 lb (100.7 kg)   SpO2 98%   BMI 30.11 kg/m  VS noted,  Constitutional: Pt is oriented to person, place, and time. Appears well-developed and well-nourished, in no significant distress and comfortable Head: Normocephalic and atraumatic  Eyes: Conjunctivae and EOM are normal. Pupils are equal, round, and reactive to light Right Ear: External ear normal without discharge Left Ear: External ear normal without discharge Nose: Nose without discharge or deformity Mouth/Throat: Oropharynx is without other ulcerations and moist  Neck: Normal range of motion. Neck supple. No JVD present. No tracheal deviation present or significant neck LA or mass Cardiovascular: Normal rate, regular rhythm, normal heart sounds and intact distal pulses.   Pulmonary/Chest: WOB normal and breath sounds without rales or wheezing  Abdominal: Soft. Bowel sounds are normal. NT. No HSM  Musculoskeletal: Normal range of motion. Exhibits no edema Lymphadenopathy: Has no other cervical adenopathy.  Neurological: Pt is alert and oriented to person, place, and time. Pt has normal reflexes. No cranial nerve deficit. Motor grossly intact, Gait intact Skin: Skin is warm and dry. No rash noted or new ulcerations Psychiatric:  Has normal mood and affect. Behavior is normal without agitation Right knee with 1+ effusion, mild tender, reduced ROM, mild crepitus No other exam findings Lab Results  Component Value Date   WBC 3.1 (L) 03/15/2019   HGB 14.1 03/15/2019   HCT 42.4 03/15/2019   PLT 139.0 (L)  03/15/2019   GLUCOSE 92 03/15/2019   CHOL 174 03/15/2019   TRIG 71.0 03/15/2019   HDL 58.90 03/15/2019   LDLCALC 101 (H) 03/15/2019   ALT 12 03/15/2019   AST 17 03/15/2019   NA 140 03/15/2019   K 4.3 03/15/2019   CL 105 03/15/2019   CREATININE 1.08 03/15/2019   BUN 15 03/15/2019   CO2 29 03/15/2019   TSH 0.68 03/15/2019   PSA 0.49 03/15/2019          Assessment & Plan:

## 2019-03-16 ENCOUNTER — Encounter: Payer: Self-pay | Admitting: Internal Medicine

## 2019-03-16 ENCOUNTER — Other Ambulatory Visit: Payer: Self-pay | Admitting: Internal Medicine

## 2019-03-16 DIAGNOSIS — E559 Vitamin D deficiency, unspecified: Secondary | ICD-10-CM | POA: Insufficient documentation

## 2019-03-16 MED ORDER — VITAMIN D (ERGOCALCIFEROL) 1.25 MG (50000 UNIT) PO CAPS: 50000.0000 [IU] | ORAL_CAPSULE | ORAL | 0 refills | Status: DC

## 2019-03-16 NOTE — Assessment & Plan Note (Signed)
For f/u lab, may need replacement

## 2019-03-16 NOTE — Assessment & Plan Note (Signed)
stable overall by history and exam, recent data reviewed with pt, and pt to continue medical treatment as before,  to f/u any worsening symptoms or concerns,, for ldl with labs

## 2019-03-16 NOTE — Assessment & Plan Note (Signed)
stable overall by history and exam, recent data reviewed with pt, and pt to continue medical treatment as before,  to f/u any worsening symptoms or concerns  

## 2019-03-16 NOTE — Assessment & Plan Note (Addendum)
Suspect DJD and remains with pain, for diclofenac prn, also refer Sports Medicine  In addition to the time spent performing CPE, I spent an additional 25 minutes face to face,in which greater than 50% of this time was spent in counseling and coordination of care for patient's acute illness as documented, including the differential dx, treatment, further evaluation and other management of right knee pain and swelling, HTN, GERD, HLD, vit d deficiency

## 2019-03-16 NOTE — Assessment & Plan Note (Signed)

## 2019-03-18 ENCOUNTER — Telehealth: Payer: Self-pay

## 2019-03-18 NOTE — Telephone Encounter (Signed)
-----   Message from Biagio Borg, MD sent at 03/16/2019 10:41 AM EDT ----- Letter sent, cont same tx except  The test results show that your current treatment is OK, as the tests are stable, except the Vitamin D level is low.  Please take Vitamin D 50000 units weekly for 12 weeks, then plan to change to OTC Vitamin D3 at 2000 units per day, indefinitely.Redmond Baseman to please inform pt, I will do rx

## 2019-03-18 NOTE — Telephone Encounter (Signed)
Called pt, LVM.   CRM created.  

## 2019-04-05 ENCOUNTER — Other Ambulatory Visit: Payer: Self-pay

## 2019-04-05 ENCOUNTER — Ambulatory Visit: Payer: 59 | Admitting: Family Medicine

## 2019-04-05 ENCOUNTER — Encounter: Payer: Self-pay | Admitting: Family Medicine

## 2019-04-05 VITALS — BP 124/80 | HR 84 | Ht 72.0 in | Wt 219.0 lb

## 2019-04-05 DIAGNOSIS — G8929 Other chronic pain: Secondary | ICD-10-CM | POA: Diagnosis not present

## 2019-04-05 DIAGNOSIS — M25561 Pain in right knee: Secondary | ICD-10-CM | POA: Diagnosis not present

## 2019-04-05 DIAGNOSIS — S838X1A Sprain of other specified parts of right knee, initial encounter: Secondary | ICD-10-CM

## 2019-04-05 NOTE — Assessment & Plan Note (Signed)
Acute on chronic tear of the medial meniscus.  We discussed with patient in great length about twisting motions, topical anti-inflammatories and, tenosynovitis, discussed vitamin D supplementation of the x-rays.  Patient does workout on a regular basis and will continue to do so.  We discussed which activities to avoid because of this.  Follow-up with me again 4 to 6 weeks.  Future considerations include Visco therapy and injections.

## 2019-04-05 NOTE — Progress Notes (Signed)
[facility] Jimmy Hess, [place] [address] Phone: [phone] Subjective:   I Jimmy Hess am serving as a Education administrator for Dr. Hulan Saas.   CC: right knee pain   MPN:[id number]  Jimmy Hess is a 53 y.o. male coming in with complaint of right knee pain. Dr. Jenny Reichmann stated that his knee is swollen during a physical. Has been to PT for the knee. States that he has been doing a lot of exercise on pavement outside when he first noticed the swelling. Pain with squatting yesterday. Shoulder pain as well. Plays golf and used to compete in body building. Swinging the club hurts his shoulder. Believes he ha torn something in his shoulder.   Onset- Chronic  Location - medial knee, ac joint Duration-  Character- dull  Aggravating factors- squat, pavement  Reliving factors- ice, Duxis Therapies tried-  Ice only  Severity- 5/10      Past Medical History:  Diagnosis Date  . ALLERGIC RHINITIS 05/16/2007   Qualifier: Diagnosis of  By: Jenny Reichmann MD, Hunt Oris   . Allergy   . Arthritis   . ERECTILE DYSFUNCTION 05/12/2007   Qualifier: Diagnosis of  By: Jenny Reichmann MD, Hunt Oris   . GERD 05/16/2007   Qualifier: Diagnosis of  By: Jenny Reichmann MD, Hunt Oris   . Glucose 6 phosphatase deficiency ([place])   . GLUCOSE-6-PHOSPHATE DEHYDROGENASE DEFICIENCY 05/12/2007   Qualifier: Diagnosis of  By: Elveria Royals   . HYPERLIPIDEMIA 09/10/2008   Qualifier: Diagnosis of  By: Jenny Reichmann MD, Hunt Oris   . HYPERTENSION 05/16/2007   Qualifier: Diagnosis of  By: Jenny Reichmann MD, Hunt Oris; HTN meds discontinued by provider   Past Surgical History:  Procedure Laterality Date  . FRACTURE SURGERY     ankle repair  . KNEE SURGERY    . NASAL SEPTUM SURGERY     Social History   Socioeconomic History  . Marital status: Divorced    Spouse name: Not on file  . Number of children: Not on file  . Years of education: Not on file  . Highest education level: Not on file  Occupational History  . Not on file  Social  Needs  . Financial resource strain: Not on file  . Food insecurity    Worry: Not on file    Inability: Not on file  . Transportation needs    Medical: Not on file    Non-medical: Not on file  Tobacco Use  . Smoking status: Former Research scientist (life sciences)  . Smokeless tobacco: Never Used  Substance and Sexual Activity  . Alcohol use: Yes    Comment: occasionally  . Drug use: No  . Sexual activity: Not on file  Lifestyle  . Physical activity    Days per week: Not on file    Minutes per session: Not on file  . Stress: Not on file  Relationships  . Social Herbalist on phone: Not on file    Gets together: Not on file    Attends religious service: Not on file    Active member of club or organization: Not on file    Attends meetings of clubs or organizations: Not on file    Relationship status: Not on file  Other Topics Concern  . Not on file  Social History Narrative  . Not on file   Allergies  Allergen Reactions  . Aspirin Other (See Comments)    Cannot have due to blood deficiency  . Sulfonamide Derivatives  Other (See Comments)    Patient unsure   Family History  Problem Relation Age of Onset  . Hypertension Mother   . Prostate cancer Father   . Heart failure Maternal Grandmother   . Hypertension Maternal Grandmother   . Colon cancer Neg Hx      Current Outpatient Medications (Cardiovascular):  .  telmisartan (MICARDIS) 40 MG tablet, TAKE 1 TABLET BY MOUTH EVERY DAY  Current Outpatient Medications (Respiratory):  .  albuterol (PROVENTIL HFA;VENTOLIN HFA) 108 (90 Base) MCG/ACT inhaler, Inhale 2 puffs into the lungs every 6 (six) hours as needed for wheezing or shortness of breath.  Current Outpatient Medications (Analgesics):  .  meloxicam (MOBIC) 15 MG tablet, Take 1 tablet (15 mg total) by mouth daily. As needed for pain   Current Outpatient Medications (Other):  Marland Kitchen.  Amino Acids (AMINO ACID PO), Take 1 tablet by mouth daily. .  Diclofenac Sodium (PENNSAID) 2 %  SOLN, Place 1 application onto the skin 2 (two) times daily. .  Multiple Vitamin (MULTIVITAMIN) tablet, Take 1 tablet by mouth daily. .  Vitamin D, Ergocalciferol, (DRISDOL) 1.25 MG (50000 UT) CAPS capsule, Take 1 capsule (50,000 Units total) by mouth every 7 (seven) days.    Past medical history, social, surgical and family history all reviewed in electronic medical record.  No pertanent information unless stated regarding to the chief complaint.   Review of Systems:  No headache, visual changes, nausea, vomiting, diarrhea, constipation, dizziness, abdominal pain, skin rash, fevers, chills, night sweats, weight loss, swollen lymph nodes, body aches, joint swelling, muscle aches, chest pain, shortness of breath, mood changes.   Objective  Blood pressure 124/80, pulse 84, height 6' (1.829 m), weight 219 lb (99.3 kg), SpO2 97 %.    General: No apparent distress alert and oriented x3 mood and affect normal, dressed appropriately.  HEENT: Pupils equal, extraocular movements intact  Respiratory: Patient's speak in full sentences and does not appear short of breath  Cardiovascular: No lower extremity edema, non tender, no erythema  Skin: Warm dry intact with no signs of infection or rash on extremities or on axial skeleton.  Abdomen: Soft nontender  Neuro: Cranial nerves II through XII are intact, neurovascularly intact in all extremities with 2+ DTRs and 2+ pulses.  Lymph: No lymphadenopathy of posterior or anterior cervical chain or axillae bilaterally.  Gait normal with good balance and coordination.  MSK:  Non tender with full range of motion and good stability and symmetric strength and tone of shoulders, elbows, wrist, hip, and ankles bilaterally.  Right knee exam shows the patient is tender to palpation over the medial joint line.  Patient does have a positive McMurray's noted.  No significant instability.  Good strength of the VMO.  Full range of motion.  Neurovascular intact distally   Limited musculoskeletal ultrasound was performed and interpreted by Judi SaaZachary M Benedicta Sultan  Limited ultrasound shows the patient does have mild to moderate osteoarthritic changes more of the medial compartment.  Patient does have displacement with a medial meniscal tear noted.  Trace effusion of the knee and also noted of the patellofemoral joint. Impression: Medial meniscal tear with moderate arthritic changes   Impression and Recommendations:     This case required medical decision making of moderate complexity. The above documentation has been reviewed and is accurate and complete Judi SaaZachary M Prabhnoor Ellenberger, DO       Note: This dictation was prepared with Dragon dictation along with smaller phrase technology. Any transcriptional errors that result from this  process are unintentional.

## 2019-04-05 NOTE — Patient Instructions (Signed)
Shoulder bursitis/meniscus Pennsaid Keep hands within peripheral vision No twisting with knee Ice 20 min 2x a day See me again in 5-6 weeks

## 2019-04-19 ENCOUNTER — Other Ambulatory Visit: Payer: Self-pay | Admitting: Internal Medicine

## 2019-05-10 ENCOUNTER — Ambulatory Visit: Payer: 59 | Admitting: Family Medicine

## 2019-05-10 NOTE — Progress Notes (Deleted)
Jimmy [facility] D.O.  [facility] [address] [place], [place] [address] Phone: [phone] Subjective:    I'm seeing this patient by the request  of:    CC:   BJY:[id number]:Subjective   04/05/2019 Acute on chronic tear of the medial meniscus.  We discussed with patient in great length about twisting motions, topical anti-inflammatories and, tenosynovitis, discussed vitamin D supplementation of the x-rays.  Patient does workout on a regular basis and will continue to do so.  We discussed which activities to avoid because of this.  Follow-up with me again 4 to 6 weeks.  Future considerations include Visco therapy and injections  Update 05/10/2019 Jimmy PerkingRodney K Hess is a 53 y.o. male coming in with complaint of right knee pain. Patient states  Onset-  Location Duration-  Character- Aggravating factors- Reliving factors-  Therapies tried-  Severity-     Past Medical History:  Diagnosis Date  . ALLERGIC RHINITIS 05/16/2007   Qualifier: Diagnosis of  By: Jonny RuizJohn MD, Len BlalockJames W   . Allergy   . Arthritis   . ERECTILE DYSFUNCTION 05/12/2007   Qualifier: Diagnosis of  By: Jonny RuizJohn MD, Len BlalockJames W   . GERD 05/16/2007   Qualifier: Diagnosis of  By: Jonny RuizJohn MD, Len BlalockJames W   . Glucose 6 phosphatase deficiency (HCC)   . GLUCOSE-6-PHOSPHATE DEHYDROGENASE DEFICIENCY 05/12/2007   Qualifier: Diagnosis of  By: Maris BergerSherwood, Elizabeth Ann   . HYPERLIPIDEMIA 09/10/2008   Qualifier: Diagnosis of  By: Jonny RuizJohn MD, Len BlalockJames W   . HYPERTENSION 05/16/2007   Qualifier: Diagnosis of  By: Jonny RuizJohn MD, Len BlalockJames W; HTN meds discontinued by provider   Past Surgical History:  Procedure Laterality Date  . FRACTURE SURGERY     ankle repair  . KNEE SURGERY    . NASAL SEPTUM SURGERY     Social History   Socioeconomic History  . Marital status: Divorced    Spouse name: Not on file  . Number of children: Not on file  . Years of education: Not on file  . Highest education level: Not on file  Occupational History  . Not on file  Social Needs  .  Financial resource strain: Not on file  . Food insecurity    Worry: Not on file    Inability: Not on file  . Transportation needs    Medical: Not on file    Non-medical: Not on file  Tobacco Use  . Smoking status: Former Games developermoker  . Smokeless tobacco: Never Used  Substance and Sexual Activity  . Alcohol use: Yes    Comment: occasionally  . Drug use: No  . Sexual activity: Not on file  Lifestyle  . Physical activity    Days per week: Not on file    Minutes per session: Not on file  . Stress: Not on file  Relationships  . Social Musicianconnections    Talks on phone: Not on file    Gets together: Not on file    Attends religious service: Not on file    Active member of club or organization: Not on file    Attends meetings of clubs or organizations: Not on file    Relationship status: Not on file  Other Topics Concern  . Not on file  Social History Narrative  . Not on file   Allergies  Allergen Reactions  . Aspirin Other (See Comments)    Cannot have due to blood deficiency  . Sulfonamide Derivatives Other (See Comments)    Patient unsure   Family History  Problem Relation Age of Onset  . Hypertension Mother   . Prostate cancer Father   . Heart failure Maternal Grandmother   . Hypertension Maternal Grandmother   . Colon cancer Neg Hx      Current Outpatient Medications (Cardiovascular):  .  telmisartan (MICARDIS) 40 MG tablet, TAKE 1 TABLET BY MOUTH EVERY DAY  Current Outpatient Medications (Respiratory):  .  albuterol (PROVENTIL HFA;VENTOLIN HFA) 108 (90 Base) MCG/ACT inhaler, Inhale 2 puffs into the lungs every 6 (six) hours as needed for wheezing or shortness of breath.  Current Outpatient Medications (Analgesics):  .  meloxicam (MOBIC) 15 MG tablet, Take 1 tablet (15 mg total) by mouth daily. As needed for pain   Current Outpatient Medications (Other):  Marland Kitchen  Amino Acids (AMINO ACID PO), Take 1 tablet by mouth daily. .  Diclofenac Sodium (PENNSAID) 2 % SOLN, Place 1  application onto the skin 2 (two) times daily. .  Multiple Vitamin (MULTIVITAMIN) tablet, Take 1 tablet by mouth daily. .  Vitamin D, Ergocalciferol, (DRISDOL) 1.25 MG (50000 UT) CAPS capsule, Take 1 capsule (50,000 Units total) by mouth every 7 (seven) days.    Past medical history, social, surgical and family history all reviewed in electronic medical record.  No pertanent information unless stated regarding to the chief complaint.   Review of Systems:  No headache, visual changes, nausea, vomiting, diarrhea, constipation, dizziness, abdominal pain, skin rash, fevers, chills, night sweats, weight loss, swollen lymph nodes, body aches, joint swelling, muscle aches, chest pain, shortness of breath, mood changes.   Objective  There were no vitals taken for this visit. Systems examined below as of    General: No apparent distress alert and oriented x3 mood and affect normal, dressed appropriately.  HEENT: Pupils equal, extraocular movements intact  Respiratory: Patient's speak in full sentences and does not appear short of breath  Cardiovascular: No lower extremity edema, non tender, no erythema  Skin: Warm dry intact with no signs of infection or rash on extremities or on axial skeleton.  Abdomen: Soft nontender  Neuro: Cranial nerves II through XII are intact, neurovascularly intact in all extremities with 2+ DTRs and 2+ pulses.  Lymph: No lymphadenopathy of posterior or anterior cervical chain or axillae bilaterally.  Gait normal with good balance and coordination.  MSK:  Non tender with full range of motion and good stability and symmetric strength and tone of shoulders, elbows, wrist, hip, knee and ankles bilaterally.     Impression and Recommendations:     This case required medical decision making of moderate complexity. The above documentation has been reviewed and is accurate and complete Lyndal Pulley, DO       Note: This dictation was prepared with Dragon dictation  along with smaller phrase technology. Any transcriptional errors that result from this process are unintentional.

## 2019-08-09 ENCOUNTER — Other Ambulatory Visit: Payer: Self-pay | Admitting: Internal Medicine

## 2019-10-05 ENCOUNTER — Other Ambulatory Visit: Payer: Self-pay | Admitting: Internal Medicine

## 2019-10-05 NOTE — Telephone Encounter (Signed)
Please refill as per office routine med refill policy (all routine meds refilled for 3 mo or monthly per pt preference up to one year from last visit, then month to month grace period for 3 mo, then further med refills will have to be denied)  

## 2019-10-07 NOTE — Telephone Encounter (Signed)
This medication is not listed on patients med list. It was changed from Telmisartan.

## 2019-11-09 ENCOUNTER — Ambulatory Visit: Payer: 59 | Attending: Internal Medicine

## 2019-11-09 DIAGNOSIS — Z23 Encounter for immunization: Secondary | ICD-10-CM | POA: Insufficient documentation

## 2019-11-09 NOTE — Progress Notes (Signed)
Covid-19 Vaccination Clinic  Name:  KAHNE STANKE    MRN: [id number] DOB: Oct 23, 1965  11/09/2019  Mr. Cruthirds was observed post Covid-19 immunization for 15 minutes without incident. He was provided with Vaccine Information Sheet and instruction to access the V-Safe system.   Mr. Klahn was instructed to call 911 with any severe reactions post vaccine: Marland Kitchen Difficulty breathing  . Swelling of face and throat  . A fast heartbeat  . A bad rash all over body  . Dizziness and weakness   Immunizations Administered    Name Date Dose VIS Date Route   Pfizer COVID-19 Vaccine 11/09/2019 10:01 AM 0.3 mL 08/16/2019 Intramuscular   Manufacturer: [employer], [employer]   Lot: [id number]   NDC: [address]-5

## 2019-11-14 ENCOUNTER — Other Ambulatory Visit: Payer: Self-pay

## 2019-11-14 MED ORDER — TELMISARTAN 40 MG PO TABS: 40.0000 mg | ORAL_TABLET | Freq: Every day | ORAL | 2 refills | Status: DC

## 2019-11-30 ENCOUNTER — Ambulatory Visit: Payer: 59 | Attending: Internal Medicine

## 2019-11-30 DIAGNOSIS — Z23 Encounter for immunization: Secondary | ICD-10-CM

## 2019-11-30 NOTE — Progress Notes (Signed)
Covid-19 Vaccination Clinic  Name:  Jimmy Hess    MRN: [id number] DOB: 07-12-1966  11/30/2019  [name] was observed post Covid-19 immunization for 15 minutes without incident. He was provided with Vaccine Information Sheet and instruction to access the V-Safe system.   [name] was instructed to call 911 with any severe reactions post vaccine: Marland Kitchen Difficulty breathing  . Swelling of face and throat  . A fast heartbeat  . A bad rash all over body  . Dizziness and weakness   Immunizations Administered    Name Date Dose VIS Date Route   Pfizer COVID-19 Vaccine 11/30/2019 10:45 AM 0.3 mL 08/16/2019 Intramuscular   Manufacturer: [employer], [employer]   Lot: [id number]   NDC: [address]-2

## 2020-02-15 ENCOUNTER — Other Ambulatory Visit: Payer: Self-pay | Admitting: Internal Medicine

## 2020-02-15 NOTE — Telephone Encounter (Signed)
Please refill as per office routine med refill policy (all routine meds refilled for 3 mo or monthly per pt preference up to one year from last visit, then month to month grace period for 3 mo, then further med refills will have to be denied)  

## 2020-03-20 ENCOUNTER — Encounter: Payer: Self-pay | Admitting: Internal Medicine

## 2020-03-20 ENCOUNTER — Ambulatory Visit (INDEPENDENT_AMBULATORY_CARE_PROVIDER_SITE_OTHER): Payer: 59 | Admitting: Internal Medicine

## 2020-03-20 ENCOUNTER — Other Ambulatory Visit: Payer: Self-pay | Admitting: Internal Medicine

## 2020-03-20 ENCOUNTER — Other Ambulatory Visit: Payer: Self-pay

## 2020-03-20 VITALS — BP 134/86 | HR 73 | Temp 99.4°F | Ht 72.0 in | Wt 217.0 lb

## 2020-03-20 DIAGNOSIS — Z0000 Encounter for general adult medical examination without abnormal findings: Secondary | ICD-10-CM | POA: Diagnosis not present

## 2020-03-20 DIAGNOSIS — E785 Hyperlipidemia, unspecified: Secondary | ICD-10-CM

## 2020-03-20 DIAGNOSIS — E559 Vitamin D deficiency, unspecified: Secondary | ICD-10-CM

## 2020-03-20 DIAGNOSIS — J309 Allergic rhinitis, unspecified: Secondary | ICD-10-CM | POA: Diagnosis not present

## 2020-03-20 DIAGNOSIS — Z1159 Encounter for screening for other viral diseases: Secondary | ICD-10-CM | POA: Diagnosis not present

## 2020-03-20 MED ORDER — FEXOFENADINE HCL 180 MG PO TABS
180.0000 mg | ORAL_TABLET | Freq: Every day | ORAL | 3 refills | Status: DC
Start: 2020-03-20 — End: 2021-03-23

## 2020-03-20 NOTE — Progress Notes (Signed)
Subjective:    Patient ID: Jimmy Hess, male    DOB: 08/23/1966, 54 y.o.   MRN: [id number]  HPI  Here for wellness and f/u;  Overall doing ok;  Pt denies Chest pain, worsening SOB, DOE, wheezing, orthopnea, PND, worsening LE edema, palpitations, dizziness or syncope.  Pt denies neurological change such as new headache, facial or extremity weakness.  Pt denies polydipsia, polyuria, or low sugar symptoms. Pt states overall good compliance with treatment and medications, good tolerability, and has been trying to follow appropriate diet.  Pt denies worsening depressive symptoms, suicidal ideation or panic. No fever, night sweats, wt loss, loss of appetite, or other constitutional symptoms.  Pt states good ability with ADL's, has low fall risk, home safety reviewed and adequate, no other significant changes in hearing or vision, and only occasionally active with exercise. Father in law died with prostate ca last mo, some grieving today Past Medical History:  Diagnosis Date  . ALLERGIC RHINITIS 05/16/2007   Qualifier: Diagnosis of  By: Jonny Ruiz MD, Len Blalock   . Allergy   . Arthritis   . ERECTILE DYSFUNCTION 05/12/2007   Qualifier: Diagnosis of  By: Jonny Ruiz MD, Len Blalock   . GERD 05/16/2007   Qualifier: Diagnosis of  By: Jonny Ruiz MD, Len Blalock   . Glucose 6 phosphatase deficiency (HCC)   . GLUCOSE-6-PHOSPHATE DEHYDROGENASE DEFICIENCY 05/12/2007   Qualifier: Diagnosis of  By: Maris Berger   . HYPERLIPIDEMIA 09/10/2008   Qualifier: Diagnosis of  By: Jonny Ruiz MD, Len Blalock   . HYPERTENSION 05/16/2007   Qualifier: Diagnosis of  By: Jonny Ruiz MD, Len Blalock; HTN meds discontinued by provider   Past Surgical History:  Procedure Laterality Date  . FRACTURE SURGERY     ankle repair  . KNEE SURGERY    . NASAL SEPTUM SURGERY      reports that he has quit smoking. He has never used smokeless tobacco. He reports current alcohol use. He reports that he does not use drugs. family history includes Heart failure in his maternal  grandmother; Hypertension in his maternal grandmother and mother; Prostate cancer in his father. Allergies  Allergen Reactions  . Aspirin Other (See Comments)    Cannot have due to blood deficiency  . Sulfonamide Derivatives Other (See Comments)    Patient unsure   Current Outpatient Medications on File Prior to Visit  Medication Sig Dispense Refill  . albuterol (PROVENTIL HFA;VENTOLIN HFA) 108 (90 Base) MCG/ACT inhaler Inhale 2 puffs into the lungs every 6 (six) hours as needed for wheezing or shortness of breath. 1 Inhaler 5  . Amino Acids (AMINO ACID PO) Take 1 tablet by mouth daily.    . Diclofenac Sodium (PENNSAID) 2 % SOLN Place 1 application onto the skin 2 (two) times daily. 112 g 5  . meloxicam (MOBIC) 15 MG tablet Take 1 tablet (15 mg total) by mouth daily. As needed for pain 30 tablet 11  . Multiple Vitamin (MULTIVITAMIN) tablet Take 1 tablet by mouth daily.    Marland Kitchen telmisartan (MICARDIS) 40 MG tablet TAKE 1 TABLET BY MOUTH EVERY DAY 30 tablet 0  . Vitamin D, Ergocalciferol, (DRISDOL) 1.25 MG (50000 UT) CAPS capsule Take 1 capsule (50,000 Units total) by mouth every 7 (seven) days. 12 capsule 0   No current facility-administered medications on file prior to visit.   Review of Systems All otherwise neg per pt     Objective:   Physical Exam BP 134/86 (BP Location: Left Arm, Patient Position: Sitting, Cuff  Size: Large)   Pulse 73   Temp 99.4 F (37.4 C) (Oral)   Ht 6' (1.829 m)   Wt 217 lb (98.4 kg)   SpO2 97%   BMI 29.43 kg/m  VS noted,  Constitutional: Pt appears in NAD HENT: Head: NCAT.  Right Ear: External ear normal.  Left Ear: External ear normal.  Eyes: . Pupils are equal, round, and reactive to light. Conjunctivae and EOM are normal Nose: without d/c or deformity Neck: Neck supple. Gross normal ROM Cardiovascular: Normal rate and regular rhythm.   Pulmonary/Chest: Effort normal and breath sounds without rales or wheezing.  Abd:  Soft, NT, ND, + BS, no  organomegaly Neurological: Pt is alert. At baseline orientation, motor grossly intact Skin: Skin is warm. No rashes, other new lesions, no LE edema Psychiatric: Pt behavior is normal without agitation  All otherwise neg per pt  Lab Results  Component Value Date   WBC 3.1 (L) 03/15/2019   HGB 14.1 03/15/2019   HCT 42.4 03/15/2019   PLT 139.0 (L) 03/15/2019   GLUCOSE 92 03/15/2019   CHOL 174 03/15/2019   TRIG 71.0 03/15/2019   HDL 58.90 03/15/2019   LDLCALC 101 (H) 03/15/2019   ALT 12 03/15/2019   AST 17 03/15/2019   NA 140 03/15/2019   K 4.3 03/15/2019   CL 105 03/15/2019   CREATININE 1.08 03/15/2019   BUN 15 03/15/2019   CO2 29 03/15/2019   TSH 0.68 03/15/2019   PSA 0.49 03/15/2019        Assessment & Plan:

## 2020-03-20 NOTE — Telephone Encounter (Signed)
Please refill as per office routine med refill policy (all routine meds refilled for 3 mo or monthly per pt preference up to one year from last visit, then month to month grace period for 3 mo, then further med refills will have to be denied)  

## 2020-03-20 NOTE — Patient Instructions (Signed)
Please take all new medication as prescribed - the allegra for allergies  Please continue all other medications as before, and refills have been done if requested.  Please have the pharmacy call with any other refills you may need.  Please continue your efforts at being more active, low cholesterol diet, and weight control.  You are otherwise up to date with prevention measures today.  Please keep your appointments with your specialists as you may have planned  Please go to the LAB at the blood drawing area for the tests to be done  You will be contacted by phone if any changes need to be made immediately.  Otherwise, you will receive a letter about your results with an explanation, but please check with MyChart first.  Please remember to sign up for MyChart if you have not done so, as this will be important to you in the future with finding out test results, communicating by private email, and scheduling acute appointments online when needed.  Please make an Appointment to return for your 1 year visit, or sooner if needed

## 2020-03-21 ENCOUNTER — Encounter: Payer: Self-pay | Admitting: Internal Medicine

## 2020-03-21 NOTE — Assessment & Plan Note (Signed)
For allegra prn 

## 2020-03-21 NOTE — Assessment & Plan Note (Signed)
For oral replacement 

## 2020-03-21 NOTE — Assessment & Plan Note (Signed)
Cont diet, declines statin for now

## 2020-03-21 NOTE — Assessment & Plan Note (Signed)

## 2020-03-22 LAB — CBC WITH DIFFERENTIAL/PLATELET
Absolute Monocytes: 354 cells/uL (ref 200–950)
Basophils Absolute: 21 cells/uL (ref 0–200)
Basophils Relative: 0.7 %
Eosinophils Absolute: 60 cells/uL (ref 15–500)
Eosinophils Relative: 2 %
HCT: 42.8 % (ref 38.5–50.0)
Hemoglobin: 14.4 g/dL (ref 13.2–17.1)
Lymphs Abs: 1185 cells/uL (ref 850–3900)
MCH: 32.3 pg (ref 27.0–33.0)
MCHC: 33.6 g/dL (ref 32.0–36.0)
MCV: 96 fL (ref 80.0–100.0)
MPV: 10.2 fL (ref 7.5–12.5)
Monocytes Relative: 11.8 %
Neutro Abs: 1380 cells/uL — ABNORMAL LOW (ref 1500–7800)
Neutrophils Relative %: 46 %
Platelets: 147 10*3/uL (ref 140–400)
RBC: 4.46 10*6/uL (ref 4.20–5.80)
RDW: 11.4 % (ref 11.0–15.0)
Total Lymphocyte: 39.5 %
WBC: 3 10*3/uL — ABNORMAL LOW (ref 3.8–10.8)

## 2020-03-22 LAB — HEPATIC FUNCTION PANEL
AG Ratio: 1.8 (calc) (ref 1.0–2.5)
ALT: 11 U/L (ref 9–46)
AST: 15 U/L (ref 10–35)
Albumin: 4.4 g/dL (ref 3.6–5.1)
Alkaline phosphatase (APISO): 54 U/L (ref 35–144)
Bilirubin, Direct: 0.2 mg/dL (ref 0.0–0.2)
Globulin: 2.5 g/dL (calc) (ref 1.9–3.7)
Indirect Bilirubin: 0.9 mg/dL (calc) (ref 0.2–1.2)
Total Bilirubin: 1.1 mg/dL (ref 0.2–1.2)
Total Protein: 6.9 g/dL (ref 6.1–8.1)

## 2020-03-22 LAB — LIPID PANEL
Cholesterol: 189 mg/dL (ref <200)
HDL: 61 mg/dL (ref >=40)
LDL Cholesterol (Calc): 113 mg/dL (calc) — ABNORMAL HIGH
Non-HDL Cholesterol (Calc): 128 mg/dL (calc) (ref <130)
Total CHOL/HDL Ratio: 3.1 (calc) (ref <5.0)
Triglycerides: 67 mg/dL (ref <150)

## 2020-03-22 LAB — BASIC METABOLIC PANEL
BUN: 12 mg/dL (ref 7–25)
CO2: 26 mmol/L (ref 20–32)
Calcium: 9.5 mg/dL (ref 8.6–10.3)
Chloride: 106 mmol/L (ref 98–110)
Creat: 1.02 mg/dL (ref 0.70–1.33)
Glucose, Bld: 84 mg/dL (ref 65–99)
Potassium: 4.1 mmol/L (ref 3.5–5.3)
Sodium: 141 mmol/L (ref 135–146)

## 2020-03-22 LAB — URINALYSIS, ROUTINE W REFLEX MICROSCOPIC

## 2020-03-22 LAB — PSA: PSA: 0.4 ng/mL (ref <=4.0)

## 2020-03-22 LAB — VITAMIN D 25 HYDROXY (VIT D DEFICIENCY, FRACTURES): Vit D, 25-Hydroxy: 30 ng/mL (ref 30–100)

## 2020-03-22 LAB — TSH: TSH: 0.91 mIU/L (ref 0.40–4.50)

## 2020-03-23 ENCOUNTER — Encounter: Payer: Self-pay | Admitting: Internal Medicine

## 2020-03-23 LAB — HEPATITIS C ANTIBODY
Hepatitis C Ab: NONREACTIVE
SIGNAL TO CUT-OFF: 0.01 (ref <1.00)

## 2020-04-22 ENCOUNTER — Other Ambulatory Visit: Payer: Self-pay | Admitting: Internal Medicine

## 2020-04-22 NOTE — Telephone Encounter (Signed)
Please refill as per office routine med refill policy (all routine meds refilled for 3 mo or monthly per pt preference up to one year from last visit, then month to month grace period for 3 mo, then further med refills will have to be denied)  

## 2020-05-20 ENCOUNTER — Other Ambulatory Visit: Payer: Self-pay | Admitting: Internal Medicine

## 2020-05-20 NOTE — Telephone Encounter (Signed)
Please refill as per office routine med refill policy (all routine meds refilled for 3 mo or monthly per pt preference up to one year from last visit, then month to month grace period for 3 mo, then further med refills will have to be denied)  

## 2020-10-09 ENCOUNTER — Encounter: Payer: Self-pay | Admitting: Internal Medicine

## 2020-10-09 ENCOUNTER — Ambulatory Visit: Payer: 59 | Admitting: Internal Medicine

## 2020-10-09 ENCOUNTER — Other Ambulatory Visit: Payer: Self-pay

## 2020-10-09 VITALS — BP 138/74 | HR 73 | Temp 98.8°F | Ht 72.0 in | Wt 225.0 lb

## 2020-10-09 DIAGNOSIS — I10 Essential (primary) hypertension: Secondary | ICD-10-CM | POA: Diagnosis not present

## 2020-10-09 DIAGNOSIS — E559 Vitamin D deficiency, unspecified: Secondary | ICD-10-CM | POA: Diagnosis not present

## 2020-10-09 DIAGNOSIS — M5412 Radiculopathy, cervical region: Secondary | ICD-10-CM | POA: Insufficient documentation

## 2020-10-09 DIAGNOSIS — E7849 Other hyperlipidemia: Secondary | ICD-10-CM

## 2020-10-09 MED ORDER — TRAMADOL HCL 50 MG PO TABS: 50.0000 mg | ORAL_TABLET | Freq: Four times a day (QID) | ORAL | 0 refills | Status: DC | PRN

## 2020-10-09 MED ORDER — GABAPENTIN 100 MG PO CAPS: 100.0000 mg | ORAL_CAPSULE | Freq: Three times a day (TID) | ORAL | 3 refills | Status: DC

## 2020-10-09 MED ORDER — PREDNISONE 10 MG PO TABS: ORAL_TABLET | ORAL | 0 refills | Status: DC

## 2020-10-09 MED ORDER — CYCLOBENZAPRINE HCL 5 MG PO TABS: 5.0000 mg | ORAL_TABLET | Freq: Three times a day (TID) | ORAL | 1 refills | Status: DC | PRN

## 2020-10-09 NOTE — Patient Instructions (Signed)
Please take all new medication as prescribed - the tramadol as needed for pain, the muscle relaxer (generic flexeril) as needed, and the gabapentin 100 mg three times per day for nerve pain  You will be contacted regarding the referral for: MRI and Sports Medicine  Please continue all other medications as before, and refills have been done if requested.  Please have the pharmacy call with any other refills you may need.  Please keep your appointments with your specialists as you may have planned

## 2020-10-09 NOTE — Progress Notes (Signed)
Established Patient Office Visit  Subjective:  Patient ID: Jimmy Hess, male    DOB: 27-May-1966  Age: 55 y.o. 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  Heat and topical otc lidocaine patch has helped somewhat but just not resolving.  Not sure how started.   Pt denies fever, wt loss, night sweats, loss of appetite, or other constitutional symptoms  No trauma or injury.  Nothing else seems to make better or worse except moving the head horizontally in certain position with slight looking up can make worse as well. Pt denies chest pain, increased sob or doe, wheezing, orthopnea, PND, increased LE swelling, palpitations, dizziness or syncope.   Pt denies polydipsia, polyuria,  Wt Readings from Last 3 Encounters:  10/09/20 225 lb (102.1 kg)  03/20/20 217 lb (98.4 kg)  04/05/19 219 lb (99.3 kg)   BP Readings from Last 3 Encounters:  10/09/20 138/74  03/20/20 134/86  04/05/19 124/80         Past Medical History:  Diagnosis Date  . ALLERGIC RHINITIS 05/16/2007   Qualifier: Diagnosis of  By: Jonny Ruiz MD, Len Blalock   . Allergy   . Arthritis   . ERECTILE DYSFUNCTION 05/12/2007   Qualifier: Diagnosis of  By: Jonny Ruiz MD, Len Blalock   . GERD 05/16/2007   Qualifier: Diagnosis of  By: Jonny Ruiz MD, Len Blalock   . Glucose 6 phosphatase deficiency (HCC)   . GLUCOSE-6-PHOSPHATE DEHYDROGENASE DEFICIENCY 05/12/2007   Qualifier: Diagnosis of  By: Maris Berger   . HYPERLIPIDEMIA 09/10/2008   Qualifier: Diagnosis of  By: Jonny Ruiz MD, Len Blalock   . HYPERTENSION 05/16/2007   Qualifier: Diagnosis of  By: Jonny Ruiz MD, Len Blalock; HTN meds discontinued by provider   Past Surgical History:  Procedure Laterality Date  . FRACTURE  SURGERY     ankle repair  . KNEE SURGERY    . NASAL SEPTUM SURGERY      reports that he has quit smoking. He has never used smokeless tobacco. He reports current alcohol use. He reports that he does not use drugs. family history includes Heart failure in his maternal grandmother; Hypertension in his maternal grandmother and mother; Prostate cancer in his father. Allergies  Allergen Reactions  . Aspirin Other (See Comments)    Cannot have due to blood deficiency  . Sulfonamide Derivatives Other (See Comments)    Patient unsure   Current Outpatient Medications on File Prior to Visit  Medication Sig Dispense Refill  . albuterol (PROVENTIL HFA;VENTOLIN HFA) 108 (90 Base) MCG/ACT inhaler Inhale 2 puffs into the lungs every 6 (six) hours as needed for wheezing or shortness of breath. 1 Inhaler 5  . Amino Acids (AMINO ACID PO) Take 1 tablet by mouth daily.    . Multiple Vitamin (MULTIVITAMIN) tablet Take 1 tablet by mouth daily.    Marland Kitchen telmisartan (MICARDIS) 40 MG tablet *NEED OFFICE VISIT* TAKE 1 TABLET BY MOUTH EVERY DAY. 30 tablet 5  . Vitamin D, Ergocalciferol, (DRISDOL) 1.25 MG (50000 UT) CAPS capsule Take 1 capsule (50,000 Units total) by mouth every 7 (seven) days. 12 capsule 0  . Diclofenac Sodium (PENNSAID)  2 % SOLN Place 1 application onto the skin 2 (two) times daily. (Patient not taking: Reported on 10/09/2020) 112 g 5  . fexofenadine (ALLEGRA) 180 MG tablet Take 1 tablet (180 mg total) by mouth daily. (Patient not taking: Reported on 10/09/2020) 90 tablet 3  . meloxicam (MOBIC) 15 MG tablet Take 1 tablet (15 mg total) by mouth daily. As needed for pain (Patient not taking: Reported on 10/09/2020) 30 tablet 11   No current facility-administered medications on file prior to visit.        ROS:  All others reviewed and negative.  Objective        PE:  BP 138/74   Pulse 73   Temp 98.8 F (37.1 C) (Oral)   Ht 6' (1.829 m)   Wt 225 lb (102.1 kg)   SpO2 97%   BMI 30.52 kg/m                  Constitutional: Pt appears in NAD               HENT: Head: NCAT.                Right Ear: External ear normal.                 Left Ear: External ear normal.                Eyes: . Pupils are equal, round, and reactive to light. Conjunctivae and EOM are normal               Nose: without d/c or deformity               Neck: Neck supple. Gross normal ROM               Cardiovascular: Normal rate and regular rhythm.                 Pulmonary/Chest: Effort normal and breath sounds without rales or wheezing.                Abd:  Soft, NT, ND, + BS, no organomegaly               Neurological: Pt is alert. At baseline orientation, motor grossly intact except for 4+/5 LUE weakness with decreased senstation to LT               Skin: Skin is warm. No rashes, no other new lesions, LE edema - none               Psychiatric: Pt behavior is normal without agitation   Assessment/Plan:  Jimmy Hess is a 55 y.o. Black or African American [2] male with  has a past medical history of ALLERGIC RHINITIS (05/16/2007), Allergy, Arthritis, ERECTILE DYSFUNCTION (05/12/2007), GERD (05/16/2007), Glucose 6 phosphatase deficiency (HCC), GLUCOSE-6-PHOSPHATE DEHYDROGENASE DEFICIENCY (05/12/2007), HYPERLIPIDEMIA (09/10/2008), and HYPERTENSION (05/16/2007).   Micro: none  Cardiac tracings I have personally interpreted today:  none  Pertinent Radiological findings (summarize): no prior MRI spine imagina   Lab Results  Component Value Date   WBC 3.0 (L) 03/20/2020   HGB 14.4 03/20/2020   HCT 42.8 03/20/2020   PLT 147 03/20/2020   GLUCOSE 84 03/20/2020   CHOL 189 03/20/2020   TRIG 67 03/20/2020   HDL 61 03/20/2020   LDLCALC 113 (H) 03/20/2020   ALT 11 03/20/2020   AST 15 03/20/2020   NA 141 03/20/2020   K 4.1 03/20/2020  CL 106 03/20/2020   CREATININE 1.02 03/20/2020   BUN 12 03/20/2020   CO2 26 03/20/2020   TSH 0.91 03/20/2020   PSA 0.4 03/20/2020     Assessment & Plan:   Problem List Items  Addressed This Visit      High   Left cervical radiculopathy - Primary    Mild by exam but mod to severe subjectively, now for tramadol prn, predpac asd, gabapentin 100 tid, flexeril 5 tid prn, and refer for C spine MRI, and sports medicine      Relevant Medications   cyclobenzaprine (FLEXERIL) 5 MG tablet   gabapentin (NEURONTIN) 100 MG capsule   Other Relevant Orders   MR Cervical Spine Wo Contrast   Ambulatory referral to Sports Medicine     Medium   Vitamin D deficiency    Last vitamin D Lab Results  Component Value Date   VD25OH 30 03/20/2020   Stable, cont oral replacement      Hyperlipidemia    Lab Results  Component Value Date   LDLCALC 113 (H) 03/20/2020   Stable, pt to continue current diet effortm declines statin for now       Essential hypertension    BP Readings from Last 3 Encounters:  10/09/20 138/74  03/20/20 134/86  04/05/19 124/80   Stable, pt to continue medical treatment micardis  Current Outpatient Medications (Endocrine & Metabolic):  .  predniSONE (DELTASONE) 10 MG tablet, 3 tabs by mouth per day for 3 days,2tabs per day for 3 days,1tab per day for 3 days  Current Outpatient Medications (Cardiovascular):  .  telmisartan (MICARDIS) 40 MG tablet, *NEED OFFICE VISIT* TAKE 1 TABLET BY MOUTH EVERY DAY.  Current Outpatient Medications (Respiratory):  .  albuterol (PROVENTIL HFA;VENTOLIN HFA) 108 (90 Base) MCG/ACT inhaler, Inhale 2 puffs into the lungs every 6 (six) hours as needed for wheezing or shortness of breath. .  fexofenadine (ALLEGRA) 180 MG tablet, Take 1 tablet (180 mg total) by mouth daily. (Patient not taking: Reported on 10/09/2020)  Current Outpatient Medications (Analgesics):  .  traMADol (ULTRAM) 50 MG tablet, Take 1 tablet (50 mg total) by mouth every 6 (six) hours as needed. .  meloxicam (MOBIC) 15 MG tablet, Take 1 tablet (15 mg total) by mouth daily. As needed for pain (Patient not taking: Reported on 10/09/2020)   Current  Outpatient Medications (Other):  Marland Kitchen  Amino Acids (AMINO ACID PO), Take 1 tablet by mouth daily. .  cyclobenzaprine (FLEXERIL) 5 MG tablet, Take 1 tablet (5 mg total) by mouth 3 (three) times daily as needed for muscle spasms. Marland Kitchen  gabapentin (NEURONTIN) 100 MG capsule, Take 1 capsule (100 mg total) by mouth 3 (three) times daily. .  Multiple Vitamin (MULTIVITAMIN) tablet, Take 1 tablet by mouth daily. .  Vitamin D, Ergocalciferol, (DRISDOL) 1.25 MG (50000 UT) CAPS capsule, Take 1 capsule (50,000 Units total) by mouth every 7 (seven) days. .  Diclofenac Sodium (PENNSAID) 2 % SOLN, Place 1 application onto the skin 2 (two) times daily. (Patient not taking: Reported on 10/09/2020)          Meds ordered this encounter  Medications  . traMADol (ULTRAM) 50 MG tablet    Sig: Take 1 tablet (50 mg total) by mouth every 6 (six) hours as needed.    Dispense:  30 tablet    Refill:  0  . cyclobenzaprine (FLEXERIL) 5 MG tablet    Sig: Take 1 tablet (5 mg total) by mouth 3 (three) times daily as  needed for muscle spasms.    Dispense:  60 tablet    Refill:  1  . predniSONE (DELTASONE) 10 MG tablet    Sig: 3 tabs by mouth per day for 3 days,2tabs per day for 3 days,1tab per day for 3 days    Dispense:  18 tablet    Refill:  0  . gabapentin (NEURONTIN) 100 MG capsule    Sig: Take 1 capsule (100 mg total) by mouth 3 (three) times daily.    Dispense:  90 capsule    Refill:  3    Follow-up: Return if symptoms worsen or fail to improve.   Oliver Barre, MD 10/10/2020 2:55 PM [facility]

## 2020-10-10 ENCOUNTER — Encounter: Payer: Self-pay | Admitting: Internal Medicine

## 2020-10-10 NOTE — Assessment & Plan Note (Signed)
Mild by exam but mod to severe subjectively, now for tramadol prn, predpac asd, gabapentin 100 tid, flexeril 5 tid prn, and refer for C spine MRI, and sports medicine

## 2020-10-10 NOTE — Assessment & Plan Note (Signed)
Last vitamin D Lab Results  Component Value Date   VD25OH 30 03/20/2020   Stable, cont oral replacement

## 2020-10-10 NOTE — Assessment & Plan Note (Signed)
BP Readings from Last 3 Encounters:  10/09/20 138/74  03/20/20 134/86  04/05/19 124/80   Stable, pt to continue medical treatment micardis  Current Outpatient Medications (Endocrine & Metabolic):  .  predniSONE (DELTASONE) 10 MG tablet, 3 tabs by mouth per day for 3 days,2tabs per day for 3 days,1tab per day for 3 days  Current Outpatient Medications (Cardiovascular):  .  telmisartan (MICARDIS) 40 MG tablet, *NEED OFFICE VISIT* TAKE 1 TABLET BY MOUTH EVERY DAY.  Current Outpatient Medications (Respiratory):  .  albuterol (PROVENTIL HFA;VENTOLIN HFA) 108 (90 Base) MCG/ACT inhaler, Inhale 2 puffs into the lungs every 6 (six) hours as needed for wheezing or shortness of breath. .  fexofenadine (ALLEGRA) 180 MG tablet, Take 1 tablet (180 mg total) by mouth daily. (Patient not taking: Reported on 10/09/2020)  Current Outpatient Medications (Analgesics):  .  traMADol (ULTRAM) 50 MG tablet, Take 1 tablet (50 mg total) by mouth every 6 (six) hours as needed. .  meloxicam (MOBIC) 15 MG tablet, Take 1 tablet (15 mg total) by mouth daily. As needed for pain (Patient not taking: Reported on 10/09/2020)   Current Outpatient Medications (Other):  Marland Kitchen  Amino Acids (AMINO ACID PO), Take 1 tablet by mouth daily. .  cyclobenzaprine (FLEXERIL) 5 MG tablet, Take 1 tablet (5 mg total) by mouth 3 (three) times daily as needed for muscle spasms. Marland Kitchen  gabapentin (NEURONTIN) 100 MG capsule, Take 1 capsule (100 mg total) by mouth 3 (three) times daily. .  Multiple Vitamin (MULTIVITAMIN) tablet, Take 1 tablet by mouth daily. .  Vitamin D, Ergocalciferol, (DRISDOL) 1.25 MG (50000 UT) CAPS capsule, Take 1 capsule (50,000 Units total) by mouth every 7 (seven) days. .  Diclofenac Sodium (PENNSAID) 2 % SOLN, Place 1 application onto the skin 2 (two) times daily. (Patient not taking: Reported on 10/09/2020)

## 2020-10-10 NOTE — Assessment & Plan Note (Signed)
Lab Results  Component Value Date   LDLCALC 113 (H) 03/20/2020   Stable, pt to continue current diet effortm declines statin for now

## 2020-10-28 ENCOUNTER — Ambulatory Visit
Admission: RE | Admit: 2020-10-28 | Discharge: 2020-10-28 | Disposition: A | Discharge status: Home / Self Care | Payer: 59 | Source: Ambulatory Visit | Attending: Internal Medicine | Admitting: Internal Medicine

## 2020-10-28 DIAGNOSIS — M5412 Radiculopathy, cervical region: Secondary | ICD-10-CM

## 2020-10-29 ENCOUNTER — Encounter: Payer: Self-pay | Admitting: Internal Medicine

## 2020-10-29 NOTE — Progress Notes (Signed)
Subjective:    I'm seeing this patient as a consultation for Dr. Oliver Barre. Note will be routed back to referring provider/PCP.  CC: Left cervical radiculopathy  HPI: Pt is a 55 y/o male c/o L-sided neck pain and upper back pain since late Jan 2022 w/ no known MOI. Pt is a former Visual merchandiser. Pt locates pain to his R neck/upper trap .  Pt was seen by his PCP on 10/09/20 and prescribed Flexeril, Gabapentin, prednisone dose pack and Tramadol.  He operates heavy machinery and states that the side effects of the medication caused him to stop using them mainly due to drowsiness.  He never did take the prednisone for fear of potential drowsiness side effects.  Radiates: yes into L UE to his fingertips UE Numbness/tingling: yes in L UE to his fingertips UE Weakness: No Aggravates: sitting unsupported; driving Treatments tried: heat, creams, lidocaine patches; Flexeril; Gabapentin; prednisone; Tramadol  Diagnostic testing: C-spine MRI- 10/28/20  Past medical history, Surgical history, Family history, Social history, Allergies, and medications have been entered into the medical record, reviewed.   Review of Systems: No new headache, visual changes, nausea, vomiting, diarrhea, constipation, dizziness, abdominal pain, skin rash, fevers, chills, night sweats, weight loss, swollen lymph nodes, body aches, joint swelling, muscle aches, chest pain, shortness of breath, mood changes, visual or auditory hallucinations.   Objective:    Vitals:   10/30/20 1045  BP: 120/84  Pulse: 88  SpO2: 95%   General: Well Developed, well nourished, and in no acute distress.  Neuro/Psych: Alert and oriented x3, extra-ocular muscles intact, able to move all 4 extremities, sensation grossly intact. Skin: Warm and dry, no rashes noted.  Respiratory: Not using accessory muscles, speaking in full sentences, trachea midline.  Cardiovascular: Pulses palpable, no extremity edema. Abdomen: Does not appear  distended. MSK: C-spine normal-appearing nontender midline.  Normal cervical motion.  Positive left-sided mildly positive Spurling's test mildly. Upper extremity strength reflexes and sensation are intact bilaterally.  Lab and Radiology Results No results found for this or any previous visit (from the past 72 hour(s)). MR Cervical Spine Wo Contrast  Result Date: 10/29/2020 CLINICAL DATA:  Cervical radiculopathy.  Left arm pain. EXAM: MRI CERVICAL SPINE WITHOUT CONTRAST TECHNIQUE: Multiplanar, multisequence MR imaging of the cervical spine was performed. No intravenous contrast was administered. COMPARISON:  None. FINDINGS: Alignment: Normal Vertebrae: Normal bone marrow.  Negative for fracture or mass. Cord: Normal signal and morphology. Posterior Fossa, vertebral arteries, paraspinal tissues: Negative Disc levels: C2-3: Negative C3-4: Mild foraminal narrowing bilaterally due to spurring. Spinal canal adequate in size C4-5: Mild foraminal narrowing bilaterally due to spurring. C5-6: Negative C6-7: Severe left foraminal encroachment due to prominent uncinate spurring on the left, possibly with associated left foraminal disc protrusion. Left C7 nerve root impingement. Spinal canal adequate in size. Right foramen patent. C7-T1: Negative IMPRESSION: Mild foraminal narrowing bilaterally C3-4 and C4-5 due to spurring Severe left foraminal encroachment C6-7 with left C7 nerve root impingement. Electronically Signed   By: Marlan Palau M.D.   On: 10/29/2020 08:20  I, Clementeen Graham, personally (independently) visualized and performed the interpretation of the images attached in this note.   Impression and Recommendations:    Assessment and Plan: 55 y.o. male with left C7 radiculopathy.  Still symptomatic.  Recommend completing course of oral prednisone.  Also refer to physical therapy if worsening or not improving neck step would be epidural steroid injection.  Patient will let me know.  Ultimately if not better  surgery may be needed.  Reviewed MRI and plan with patient expresses understanding and agreement.Marland Kitchen  PDMP not reviewed this encounter. Orders Placed This Encounter  Procedures  . Ambulatory referral to Physical Therapy    Referral Priority:   Routine    Referral Type:   Physical Medicine    Referral Reason:   Specialty Services Required    Requested Specialty:   Physical Therapy   No orders of the defined types were placed in this encounter.   Discussed warning signs or symptoms. Please see discharge instructions. Patient expresses understanding.   The above documentation has been reviewed and is accurate and complete Clementeen Graham, M.D.

## 2020-10-30 ENCOUNTER — Other Ambulatory Visit: Payer: Self-pay

## 2020-10-30 ENCOUNTER — Ambulatory Visit: Payer: 59 | Admitting: Family Medicine

## 2020-10-30 ENCOUNTER — Encounter: Payer: Self-pay | Admitting: Family Medicine

## 2020-10-30 VITALS — BP 120/84 | HR 88 | Ht 72.0 in | Wt 225.0 lb

## 2020-10-30 DIAGNOSIS — M5412 Radiculopathy, cervical region: Secondary | ICD-10-CM | POA: Diagnosis not present

## 2020-10-30 NOTE — Patient Instructions (Addendum)
Thank you for coming in today.  Take the prednisone now.   If this does not help enough let me know. Next step is neck injection.   Physical therapy will help.   Gabapentin can help too mostly at bedtime.  Take 300mg  (3 pills) at bedtime as needed for neve pain.   Keep me updated.  I can do more if needed.   Come back or go to the emergency room if you notice new weakness new numbness problems walking or bowel or bladder problems.   Epidural Steroid Injection  An epidural steroid injection is a shot of steroid medicine and numbing medicine that is given into the space between the spinal cord and the bones of the back (epidural space). The shot helps relieve pain caused by an irritated or swollen nerve root. The amount of pain relief you get from the injection depends on what is causing the nerve to be swollen and irritated, and how long your pain lasts. You are more likely to benefit from this injection if your pain is strong and comes on suddenly rather than if you have had long-term (chronic) pain. Tell a health care provider about:  Any allergies you have.  All medicines you are taking, including vitamins, herbs, eye drops, creams, and over-the-counter medicines.  Any problems you or family members have had with anesthetic medicines.  Any blood disorders you have.  Any surgeries you have had.  Any medical conditions you have.  Whether you are pregnant or may be pregnant. What are the risks? Generally, this is a safe procedure. However, problems may occur, including:  Headache.  Bleeding.  Infection.  Allergic reaction to medicines.  Nerve damage. What happens before the procedure? Staying hydrated Follow instructions from your health care provider about hydration, which may include:  Up to 2 hours before the procedure - you may continue to drink clear liquids, such as water, clear fruit juice, black coffee, and plain tea. Eating and drinking  restrictions Follow instructions from your health care provider about eating and drinking, which may include:  8 hours before the procedure - stop eating heavy meals or foods, such as meat, fried foods, or fatty foods.  6 hours before the procedure - stop eating light meals or foods, such as toast or cereal.  6 hours before the procedure - stop drinking milk or drinks that contain milk.  2 hours before the procedure - stop drinking clear liquids. Medicines  You may be given medicines to lower anxiety.  Ask your health care provider about: ? Changing or stopping your regular medicines. This is especially important if you are taking diabetes medicines or blood thinners. ? Taking medicines such as aspirin and ibuprofen. These medicines can thin your blood. Do not take these medicines unless your health care provider tells you to take them. ? Taking over-the-counter medicines, vitamins, herbs, and supplements. General instructions  Ask your health care provider what steps will be taken to prevent infection.  Plan to have a responsible adult take you home from the hospital or clinic.  If you will be going home right after the procedure, plan to have a responsible adult care for you for the time you are told. This is important. What happens during the procedure?  An IV will be inserted into one of your veins.  You will be given one or more of the following: ? A medicine to help you relax (sedative). ? A medicine to numb the area (local anesthetic).  You will  be asked to lie on your abdomen or sit.  The injection site will be cleaned.  A needle will be inserted through your skin into the epidural space. This may cause you some discomfort. An X-ray machine will be used to guide the needle as close as possible to the affected nerve.  A steroid medicine and a local anesthetic will be injected into the epidural space.  The needle and IV will be removed.  A bandage (dressing) will be  put over the injection site. The procedure may vary among health care providers and hospitals. What can I expect after the procedure?  Your blood pressure, heart rate, breathing rate, and blood oxygen level will be monitored until you leave the hospital or clinic.  Your arm or leg may feel weak or numb for a few hours.  The injection site may feel sore. Follow these instructions at home: Injection site care  You may remove the bandage (dressing) after 24 hours.  Check your injection site every day for signs of infection. Check for: ? Redness, swelling, or pain. ? Fluid or blood. ? Warmth. ? Pus or a bad smell. Managing pain, stiffness, and swelling  For 24 hours after the procedure: ? Avoid using heat on the injection site. ? Do not take baths, swim, or use a hot tub until your health care provider approves. Ask your health care provider if you may take showers. You may only be allowed to take sponge baths.   If directed, put ice on the injection site. To do this: ? Put ice in a plastic bag. ? Place a towel between your skin and the bag. ? Leave the ice on for 20 minutes, 2-3 times a day.   Activity  If you were given a sedative during the procedure, it can affect you for several hours. Do not drive or operate machinery until your health care provider says that it is safe.  Return to your normal activities as told by your health care provider. Ask your health care provider what activities are safe for you. General instructions  Take over-the-counter and prescription medicines only as told by your health care provider.  Drink enough fluid to keep your urine pale yellow.  Keep all follow-up visits as told by your health care provider. This is important. Contact a health care provider if:  You have any of these signs of infection: ? Redness, swelling, or pain around your injection site. ? Fluid or blood coming from your injection site. ? Warmth coming from your injection  site. ? Pus or a bad smell coming from your injection site. ? A fever.  You continue to have pain and soreness around the injection site, even after taking over-the-counter pain medicine.  You have severe, sudden, or lasting nausea or vomiting. Get help right away if:  You have severe pain at the injection site that is not relieved by medicines.  You develop a severe headache or a stiff neck.  You become sensitive to light.  You have any new numbness or weakness in your legs or arms.  You lose control of your bladder or bowel movements.  You have trouble breathing. Summary  An epidural steroid injection is a shot of steroid medicine and numbing medicine that is given into the epidural space.  The shot helps relieve pain caused by an irritated or swollen nerve root.  You are more likely to benefit from this injection if your pain is strong and comes on suddenly rather than if  you have had chronic pain. This information is not intended to replace advice given to you by your health care provider. Make sure you discuss any questions you have with your health care provider. Document Revised: 12/20/2019 Document Reviewed: 03/04/2019 Elsevier Patient Education  2021 [employer].

## 2020-11-04 ENCOUNTER — Telehealth: Payer: Self-pay | Admitting: Family Medicine

## 2020-11-04 NOTE — Telephone Encounter (Signed)
The epidural steroid injection will work better than oral steroids.  Do you want me to try to prescribe another course of oral steroids are you want me to order the epidural steroid injection?   I do not think another course of oral steroids will work much better than the last one did

## 2020-11-04 NOTE — Telephone Encounter (Signed)
Pt has finished his Predisone taper dose that was rx'd by Jonny Ruiz. He said that Dr. Denyse Amass offered to rx prednisone but patient had the taper dose from John. He wanted to be sure this is what Dr. Denyse Amass would have recommended. He is not taking the Flexeril and is taking Gabapentin at night. Has not started PT yet, feels about 50-60% better.  I think pt is looking for reassurance as he does not want to have an injection.

## 2020-11-05 NOTE — Telephone Encounter (Signed)
Spoke w/ pt and discussed options. He would like to give it a bit more time before proceeding w/ epidural steroid injection. Pt stated that he would call the clinic when he's ready to proceed.

## 2020-11-05 NOTE — Telephone Encounter (Signed)
Called and LM for pt to return call to clarify exactly what he wants/needs.  Does he want another round of oral steroids?  Does he want to proceed to the epidural?  Does he want to give it a bit more time?  Please confirm.

## 2021-01-30 ENCOUNTER — Other Ambulatory Visit: Payer: Self-pay | Admitting: Internal Medicine

## 2021-01-30 NOTE — Telephone Encounter (Signed)
Please refill as per office routine med refill policy (all routine meds refilled for 3 mo or monthly per pt preference up to one year from last visit, then month to month grace period for 3 mo, then further med refills will have to be denied)  

## 2021-03-23 ENCOUNTER — Other Ambulatory Visit: Payer: Self-pay

## 2021-03-23 ENCOUNTER — Ambulatory Visit (INDEPENDENT_AMBULATORY_CARE_PROVIDER_SITE_OTHER): Payer: 59 | Admitting: Internal Medicine

## 2021-03-23 ENCOUNTER — Encounter: Payer: Self-pay | Admitting: Internal Medicine

## 2021-03-23 VITALS — BP 130/80 | HR 74 | Temp 98.4°F | Ht 72.0 in | Wt 221.4 lb

## 2021-03-23 DIAGNOSIS — Z800 Family history of malignant neoplasm of digestive organs: Secondary | ICD-10-CM

## 2021-03-23 DIAGNOSIS — E7849 Other hyperlipidemia: Secondary | ICD-10-CM

## 2021-03-23 DIAGNOSIS — I10 Essential (primary) hypertension: Secondary | ICD-10-CM | POA: Diagnosis not present

## 2021-03-23 DIAGNOSIS — Z0000 Encounter for general adult medical examination without abnormal findings: Secondary | ICD-10-CM

## 2021-03-23 DIAGNOSIS — Z0001 Encounter for general adult medical examination with abnormal findings: Secondary | ICD-10-CM

## 2021-03-23 DIAGNOSIS — E559 Vitamin D deficiency, unspecified: Secondary | ICD-10-CM | POA: Diagnosis not present

## 2021-03-23 MED ORDER — TELMISARTAN 40 MG PO TABS: ORAL_TABLET | ORAL | 3 refills | Status: DC

## 2021-03-23 MED ORDER — GABAPENTIN 100 MG PO CAPS: 100.0000 mg | ORAL_CAPSULE | Freq: Three times a day (TID) | ORAL | 5 refills | Status: AC

## 2021-03-23 MED ORDER — CHOLECALCIFEROL 50 MCG (2000 UT) PO TABS: ORAL_TABLET | ORAL | 99 refills | Status: DC

## 2021-03-23 MED ORDER — ALBUTEROL SULFATE HFA 108 (90 BASE) MCG/ACT IN AERS: 2.0000 | INHALATION_SPRAY | Freq: Four times a day (QID) | RESPIRATORY_TRACT | 5 refills | Status: AC | PRN

## 2021-03-23 NOTE — Progress Notes (Signed)
Patient ID: Jimmy Hess, male   DOB: Jan 16, 1966, 55 y.o.   MRN: [id number]         Chief Complaint:: wellness exam and low vit d, FH colon cancer, hld, htn       HPI:  Jimmy Hess is a 55 y.o. 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  Now doing more cardio and lower carb diet.  Has gained some body fat.  Pt denies chest pain, increased sob or doe, wheezing, orthopnea, PND, increased LE swelling, palpitations, dizziness or syncope.   Pt denies polydipsia, polyuria, or new focal neuro s/s.  Not taking Vit D  Father had colon cancer, now due for colonoscopy.   Pt denies fever, wt loss, night sweats, loss of appetite, or other constitutional symptoms     Wt Readings from Last 3 Encounters:  03/23/21 221 lb 6.4 oz (100.4 kg)  10/30/20 225 lb (102.1 kg)  10/09/20 225 lb (102.1 kg)   BP Readings from Last 3 Encounters:  03/23/21 130/80  10/30/20 120/84  10/09/20 138/74   Immunization History  Administered Date(s) Administered   PFIZER(Purple Top)SARS-COV-2 Vaccination 11/09/2019, 11/30/2019   Td 09/05/1997, 09/10/2008   Health Maintenance Due  Topic Date Due   TETANUS/TDAP  09/10/2018      Past Medical History:  Diagnosis Date   ALLERGIC RHINITIS 05/16/2007   Qualifier: Diagnosis of  By: Jonny Ruiz MD, Len Blalock    Allergy    Arthritis    ERECTILE DYSFUNCTION 05/12/2007   Qualifier: Diagnosis of  By: Jonny Ruiz MD, Len Blalock    GERD 05/16/2007   Qualifier: Diagnosis of  By: Jonny Ruiz MD, Len Blalock    Glucose 6 phosphatase deficiency (HCC)    GLUCOSE-6-PHOSPHATE DEHYDROGENASE DEFICIENCY 05/12/2007   Qualifier: Diagnosis of  By: Maris Berger    HYPERLIPIDEMIA 09/10/2008   Qualifier: Diagnosis of  By: Jonny Ruiz MD, Len Blalock    HYPERTENSION 05/16/2007   Qualifier: Diagnosis of  By: Jonny Ruiz MD, Len Blalock; HTN meds discontinued by provider    Past Surgical History:  Procedure Laterality Date   FRACTURE SURGERY     ankle repair   KNEE SURGERY     NASAL SEPTUM SURGERY      reports that he has quit smoking. He has never used smokeless tobacco. He reports current alcohol use. He reports that he does not use drugs. family history includes Heart failure in his maternal grandmother; Hypertension in his maternal grandmother and mother; Prostate cancer in his father. Allergies  Allergen Reactions   Aspirin Other (See Comments)    Cannot have due to blood deficiency   Sulfonamide Derivatives Other (See Comments)    Patient unsure   Current Outpatient Medications on File Prior to Visit  Medication Sig Dispense Refill   Amino Acids (AMINO ACID PO) Take 1 tablet by mouth daily.     meloxicam (MOBIC) 15 MG tablet Take 1 tablet (15 mg total) by mouth daily. As needed for pain 30 tablet 11   Multiple Vitamin (MULTIVITAMIN) tablet Take 1 tablet by mouth daily.     No current facility-administered medications on file prior to visit.        ROS:  All others reviewed and negative.  Objective  PE:  BP 130/80 (BP Location: Left Arm, Patient Position: Sitting, Cuff Size: Large)   Pulse 74   Temp 98.4 F (36.9 C) (Oral)   Ht 6' (1.829 m)   Wt 221 lb 6.4 oz (100.4 kg)   SpO2 97%   BMI 30.03 kg/m                 Constitutional: Pt appears in NAD               HENT: Head: NCAT.                Right Ear: External ear normal.                 Left Ear: External ear normal.                Eyes: . Pupils are equal, round, and reactive to light. Conjunctivae and EOM are normal               Nose: without d/c or deformity               Neck: Neck supple. Gross normal ROM               Cardiovascular: Normal rate and regular rhythm.                 Pulmonary/Chest: Effort normal and breath sounds without rales or wheezing.                Abd:  Soft, NT, ND, + BS, no organomegaly               Neurological: Pt is alert. At baseline  orientation, motor grossly intact               Skin: Skin is warm. No rashes, no other new lesions, LE edema - none               Psychiatric: Pt behavior is normal without agitation   Micro: none  Cardiac tracings I have personally interpreted today:  none  Pertinent Radiological findings (summarize): none   Lab Results  Component Value Date   WBC 3.0 (L) 03/20/2020   HGB 14.4 03/20/2020   HCT 42.8 03/20/2020   PLT 147 03/20/2020   GLUCOSE 84 03/20/2020   CHOL 189 03/20/2020   TRIG 67 03/20/2020   HDL 61 03/20/2020   LDLCALC 113 (H) 03/20/2020   ALT 11 03/20/2020   AST 15 03/20/2020   NA 141 03/20/2020   K 4.1 03/20/2020   CL 106 03/20/2020   CREATININE 1.02 03/20/2020   BUN 12 03/20/2020   CO2 26 03/20/2020   TSH 0.91 03/20/2020   PSA 0.4 03/20/2020   Assessment/Plan:  Jimmy Hess is a 55 y.o. Black or African American [2] male with  has a past medical history of ALLERGIC RHINITIS (05/16/2007), Allergy, Arthritis, ERECTILE DYSFUNCTION (05/12/2007), GERD (05/16/2007), Glucose 6 phosphatase deficiency (HCC), GLUCOSE-6-PHOSPHATE DEHYDROGENASE DEFICIENCY (05/12/2007), HYPERLIPIDEMIA (09/10/2008), and HYPERTENSION (05/16/2007).  Encounter for well adult exam with abnormal findings Age and sex appropriate education and counseling updated with regular exercise and diet Referrals for preventative services - for colonoscopy Immunizations addressed - for tdap, declines shingirx and covid booster Smoking counseling  - none needed Evidence for depression or other mood disorder - none significant Most recent labs reviewed. I have personally reviewed and have noted: 1) the patient's medical and social history 2) The patient's current medications and supplements 3) The patient's height,  weight, and BMI have been recorded in the chart   Vitamin D deficiency Last vitamin D Lab Results  Component Value Date   VD25OH 30 03/20/2020   Low, to start oral  replacement   Hyperlipidemia Lab Results  Component Value Date   LDLCALC 113 (H) 03/20/2020   Uncontrolled, goal ldl < 100, pt to continue current low chol diet, declines statin   Essential hypertension BP Readings from Last 3 Encounters:  03/23/21 130/80  10/30/20 120/84  10/09/20 138/74   Stable, pt to continue medical treatment micardis   Family history of colon cancer Due for colonoscopy  Followup: Return in about 1 year (around 03/23/2022).  Oliver Barre, MD 03/23/2021 9:51 PM [facility]

## 2021-03-23 NOTE — Assessment & Plan Note (Signed)
Lab Results  Component Value Date   LDLCALC 113 (H) 03/20/2020   Uncontrolled, goal ldl < 100, pt to continue current low chol diet, declines statin

## 2021-03-23 NOTE — Assessment & Plan Note (Signed)
BP Readings from Last 3 Encounters:  03/23/21 130/80  10/30/20 120/84  10/09/20 138/74   Stable, pt to continue medical treatment micardis

## 2021-03-23 NOTE — Assessment & Plan Note (Signed)
Due for colonoscopy 

## 2021-03-23 NOTE — Assessment & Plan Note (Signed)
Last vitamin D Lab Results  Component Value Date   VD25OH 30 03/20/2020   Low, to start oral replacement

## 2021-03-23 NOTE — Assessment & Plan Note (Signed)
Age and sex appropriate education and counseling updated with regular exercise and diet Referrals for preventative services - for colonoscopy Immunizations addressed - for tdap, declines shingirx and covid booster Smoking counseling  - none needed Evidence for depression or other mood disorder - none significant Most recent labs reviewed. I have personally reviewed and have noted: 1) the patient's medical and social history 2) The patient's current medications and supplements 3) The patient's height, weight, and BMI have been recorded in the chart

## 2021-03-23 NOTE — Patient Instructions (Signed)
You had the Tdap tetanus shot today  You will be contacted regarding the referral for: colonoscopy  Please continue all other medications as before, and refills have been done if requested.  Please have the pharmacy call with any other refills you may need.  Please continue your efforts at being more active, low cholesterol diet, and weight control.  You are otherwise up to date with prevention measures today.  Please keep your appointments with your specialists as you may have planned  Please go to the LAB at the blood drawing area for the tests to be done  You will be contacted by phone if any changes need to be made immediately.  Otherwise, you will receive a letter about your results with an explanation, but please check with MyChart first.  Please remember to sign up for MyChart if you have not done so, as this will be important to you in the future with finding out test results, communicating by private email, and scheduling acute appointments online when needed.  Please make an Appointment to return for your 1 year visit, or sooner if needed

## 2021-03-24 LAB — CBC WITH DIFFERENTIAL/PLATELET
Basophils Absolute: 0 10*3/uL (ref 0.0–0.1)
Basophils Relative: 0.7 % (ref 0.0–3.0)
Eosinophils Absolute: 0.1 10*3/uL (ref 0.0–0.7)
Eosinophils Relative: 1.6 % (ref 0.0–5.0)
HCT: 40.5 % (ref 39.0–52.0)
Hemoglobin: 14 g/dL (ref 13.0–17.0)
Lymphocytes Relative: 42.5 % (ref 12.0–46.0)
Lymphs Abs: 1.8 10*3/uL (ref 0.7–4.0)
MCHC: 34.7 g/dL (ref 30.0–36.0)
MCV: 94.5 fl (ref 78.0–100.0)
Monocytes Absolute: 0.4 10*3/uL (ref 0.1–1.0)
Monocytes Relative: 9.2 % (ref 3.0–12.0)
Neutro Abs: 2 10*3/uL (ref 1.4–7.7)
Neutrophils Relative %: 46 % (ref 43.0–77.0)
Platelets: 168 10*3/uL (ref 150.0–400.0)
RBC: 4.28 Mil/uL (ref 4.22–5.81)
RDW: 12.5 % (ref 11.5–15.5)
WBC: 4.3 10*3/uL (ref 4.0–10.5)

## 2021-03-24 LAB — BASIC METABOLIC PANEL
BUN: 12 mg/dL (ref 6–23)
CO2: 29 mEq/L (ref 19–32)
Calcium: 9.6 mg/dL (ref 8.4–10.5)
Chloride: 102 mEq/L (ref 96–112)
Creatinine, Ser: 1.08 mg/dL (ref 0.40–1.50)
GFR: 77.35 mL/min (ref >60.00)
Glucose, Bld: 84 mg/dL (ref 70–99)
Potassium: 4.1 mEq/L (ref 3.5–5.1)
Sodium: 140 mEq/L (ref 135–145)

## 2021-03-24 LAB — URINALYSIS, ROUTINE W REFLEX MICROSCOPIC
Bilirubin Urine: NEGATIVE
Hgb urine dipstick: NEGATIVE
Ketones, ur: NEGATIVE
Leukocytes,Ua: NEGATIVE
Nitrite: NEGATIVE
RBC / HPF: NONE SEEN (ref 0–?)
Specific Gravity, Urine: <=1.005 — AB (ref 1.000–1.030)
Total Protein, Urine: NEGATIVE
Urine Glucose: NEGATIVE
Urobilinogen, UA: 0.2 (ref 0.0–1.0)
WBC, UA: NONE SEEN (ref 0–?)
pH: 6 (ref 5.0–8.0)

## 2021-03-24 LAB — LIPID PANEL
Cholesterol: 201 mg/dL — ABNORMAL HIGH (ref 0–200)
HDL: 62.3 mg/dL (ref >39.00)
LDL Cholesterol: 104 mg/dL — ABNORMAL HIGH (ref 0–99)
NonHDL: 138.35
Total CHOL/HDL Ratio: 3
Triglycerides: 172 mg/dL — ABNORMAL HIGH (ref 0.0–149.0)
VLDL: 34.4 mg/dL (ref 0.0–40.0)

## 2021-03-24 LAB — HEPATIC FUNCTION PANEL
ALT: 13 U/L (ref 0–53)
AST: 19 U/L (ref 0–37)
Albumin: 4.6 g/dL (ref 3.5–5.2)
Alkaline Phosphatase: 55 U/L (ref 39–117)
Bilirubin, Direct: 0.1 mg/dL (ref 0.0–0.3)
Total Bilirubin: 0.7 mg/dL (ref 0.2–1.2)
Total Protein: 7.2 g/dL (ref 6.0–8.3)

## 2021-03-25 ENCOUNTER — Encounter: Payer: Self-pay | Admitting: Internal Medicine

## 2021-03-25 LAB — VITAMIN D 25 HYDROXY (VIT D DEFICIENCY, FRACTURES): VITD: 46.16 ng/mL (ref 30.00–100.00)

## 2021-03-25 LAB — TSH: TSH: 2.03 u[IU]/mL (ref 0.35–5.50)

## 2021-03-25 LAB — PSA: PSA: 0.51 ng/mL (ref 0.10–4.00)

## 2021-07-19 ENCOUNTER — Other Ambulatory Visit: Payer: Self-pay | Admitting: Internal Medicine

## 2021-07-19 NOTE — Telephone Encounter (Signed)
Please refill as per office routine med refill policy (all routine meds to be refilled for 3 mo or monthly (per pt preference) up to one year from last visit, then month to month grace period for 3 mo, then further med refills will have to be denied) ? ?

## 2021-11-13 ENCOUNTER — Other Ambulatory Visit: Payer: Self-pay | Admitting: Internal Medicine

## 2021-12-28 ENCOUNTER — Telehealth: Payer: Self-pay | Admitting: Internal Medicine

## 2021-12-28 NOTE — Telephone Encounter (Signed)
Pt states he is experience increased sleepiness and dizziness while taking losartan (COZAAR) 50 MG tablet ? ?Pt inquiring if he can switched back to telmisartan ? ?Please advise ? ? ?

## 2021-12-29 NOTE — Telephone Encounter (Signed)
Oh good new - since the losartan absolutely does not do this, we do not have to change the medication, but should rather look for some other reason to have these symptoms.    thanks ?

## 2021-12-29 NOTE — Telephone Encounter (Signed)
Called pt gave him MD response. Pt states he will continue taking but if he start having increase sleepiness or any side effect he will make appt sooner..lmb ?

## 2022-02-16 ENCOUNTER — Encounter: Payer: Self-pay | Admitting: Internal Medicine

## 2022-02-16 ENCOUNTER — Ambulatory Visit: Payer: 59 | Admitting: Internal Medicine

## 2022-02-16 VITALS — BP 120/78 | HR 84 | Temp 99.5°F | Ht 72.0 in | Wt 226.3 lb

## 2022-02-16 DIAGNOSIS — M549 Dorsalgia, unspecified: Secondary | ICD-10-CM

## 2022-02-16 DIAGNOSIS — E538 Deficiency of other specified B group vitamins: Secondary | ICD-10-CM

## 2022-02-16 DIAGNOSIS — E7849 Other hyperlipidemia: Secondary | ICD-10-CM

## 2022-02-16 DIAGNOSIS — M25511 Pain in right shoulder: Secondary | ICD-10-CM

## 2022-02-16 DIAGNOSIS — R197 Diarrhea, unspecified: Secondary | ICD-10-CM | POA: Diagnosis not present

## 2022-02-16 DIAGNOSIS — Z125 Encounter for screening for malignant neoplasm of prostate: Secondary | ICD-10-CM

## 2022-02-16 DIAGNOSIS — E559 Vitamin D deficiency, unspecified: Secondary | ICD-10-CM

## 2022-02-16 DIAGNOSIS — R739 Hyperglycemia, unspecified: Secondary | ICD-10-CM

## 2022-02-16 MED ORDER — MELOXICAM 15 MG PO TABS: 15.0000 mg | ORAL_TABLET | Freq: Every day | ORAL | 3 refills | Status: AC | PRN

## 2022-02-16 MED ORDER — CYCLOBENZAPRINE HCL 5 MG PO TABS: 5.0000 mg | ORAL_TABLET | Freq: Three times a day (TID) | ORAL | 1 refills | Status: DC | PRN

## 2022-02-16 NOTE — Patient Instructions (Signed)
Please take all new medication as prescribed - the anti-inflammatory, and muscle relaxer as needed  You will be contacted regarding the referral for: sports medicine  Please continue all other medications as before, and refills have been done if requested.  Please have the pharmacy call with any other refills you may need.  Please keep your appointments with your specialists as you may have planned  Please have your lab testing including the Gluten testing a few days before your next visit

## 2022-02-16 NOTE — Progress Notes (Signed)
Patient ID: Jimmy Hess, male   DOB: 22-Sep-1965, 56 y.o.   MRN: [id number]        Chief Complaint: follow up recent diarrhea/gas/bloating, right upper back pain, and right shoulder pain       HPI:  Jimmy Hess is a 56 y.o. male here with c/o 2-3 wks onset worsening gaseous bloating and diarrhea without blood or fever but with increased frequency and occasional crampy pains, seems to be worse after higher carb diet, asking to be checked for celiac disorder  Denies worsening reflux, other abd pain, dysphagia, n/v, Pt denies chest pain, increased sob or doe, wheezing, orthopnea, PND, increased LE swelling, palpitations, dizziness or syncope.  Does also have right upper back pain from the neck to the shoulder mild to mod x 2 mo, worse to turn the head, dull, without specific radicular symptoms.  Also has what appears to be separate right shoulder dull pain only with abduction for 3-4 mo gradually worsening, mild to mod, sharp, intermittent, better to rest the arm at his side.     Now taking Vit D  Wt Readings from Last 3 Encounters:  02/16/22 226 lb 4.8 oz (102.6 kg)  03/23/21 221 lb 6.4 oz (100.4 kg)  10/30/20 225 lb (102.1 kg)   BP Readings from Last 3 Encounters:  02/16/22 120/78  03/23/21 130/80  10/30/20 120/84         Past Medical History:  Diagnosis Date   ALLERGIC RHINITIS 05/16/2007   Qualifier: Diagnosis of  By: Jonny Ruiz MD, Len Blalock    Allergy    Arthritis    ERECTILE DYSFUNCTION 05/12/2007   Qualifier: Diagnosis of  By: Jonny Ruiz MD, Len Blalock    GERD 05/16/2007   Qualifier: Diagnosis of  By: Jonny Ruiz MD, Len Blalock    Glucose 6 phosphatase deficiency (HCC)    GLUCOSE-6-PHOSPHATE DEHYDROGENASE DEFICIENCY 05/12/2007   Qualifier: Diagnosis of  By: Maris Berger    HYPERLIPIDEMIA 09/10/2008   Qualifier: Diagnosis of  By: Jonny Ruiz MD, Len Blalock    HYPERTENSION 05/16/2007   Qualifier: Diagnosis of  By: Jonny Ruiz MD, Len Blalock; HTN meds discontinued by provider   Past Surgical History:  Procedure  Laterality Date   FRACTURE SURGERY     ankle repair   KNEE SURGERY     NASAL SEPTUM SURGERY      reports that he has quit smoking. He has never used smokeless tobacco. He reports current alcohol use. He reports that he does not use drugs. family history includes Heart failure in his maternal grandmother; Hypertension in his maternal grandmother and mother; Prostate cancer in his father. Allergies  Allergen Reactions   Aspirin Other (See Comments)    Cannot have due to blood deficiency   Sulfonamide Derivatives Other (See Comments)    Patient unsure   Current Outpatient Medications on File Prior to Visit  Medication Sig Dispense Refill   albuterol (VENTOLIN HFA) 108 (90 Base) MCG/ACT inhaler Inhale 2 puffs into the lungs every 6 (six) hours as needed for wheezing or shortness of breath. 1 each 5   Amino Acids (AMINO ACID PO) Take 1 tablet by mouth daily.     Cholecalciferol 50 MCG (2000 UT) TABS 1 tab by mouth once daily 30 tablet 99   gabapentin (NEURONTIN) 100 MG capsule Take 1 capsule (100 mg total) by mouth 3 (three) times daily. 90 capsule 5   losartan (COZAAR) 50 MG tablet 1 tab by mouth once daily 90 tablet 1   Multiple  Vitamin (MULTIVITAMIN) tablet Take 1 tablet by mouth daily.     No current facility-administered medications on file prior to visit.        ROS:  All others reviewed and negative.  Objective        PE:  BP 120/78 (BP Location: Left Arm, Patient Position: Sitting, Cuff Size: Large)   Pulse 84   Temp 99.5 F (37.5 C) (Oral)   Ht 6' (1.829 m)   Wt 226 lb 4.8 oz (102.6 kg)   SpO2 96%   BMI 30.69 kg/m                 Constitutional: Pt appears in NAD               HENT: Head: NCAT.                Right Ear: External ear normal.                 Left Ear: External ear normal.                Eyes: . Pupils are equal, round, and reactive to light. Conjunctivae and EOM are normal               Nose: without d/c or deformity               Neck: Neck supple.  Gross normal ROM               Cardiovascular: Normal rate and regular rhythm.                 Pulmonary/Chest: Effort normal and breath sounds without rales or wheezing.                Abd:  Soft, NT, ND, + BS, no organomegaly               Neurological: Pt is alert. At baseline orientation, motor grossly intact               Skin: Skin is warm. No rashes, no other new lesions, LE edema -none                              Psychiatric: Pt behavior is normal without agitation   Micro: none  Cardiac tracings I have personally interpreted today:  none  Pertinent Radiological findings (summarize): none   Lab Results  Component Value Date   WBC 4.3 03/23/2021   HGB 14.0 03/23/2021   HCT 40.5 03/23/2021   PLT 168.0 03/23/2021   GLUCOSE 84 03/23/2021   CHOL 201 (H) 03/23/2021   TRIG 172.0 (H) 03/23/2021   HDL 62.30 03/23/2021   LDLCALC 104 (H) 03/23/2021   ALT 13 03/23/2021   AST 19 03/23/2021   NA 140 03/23/2021   K 4.1 03/23/2021   CL 102 03/23/2021   CREATININE 1.08 03/23/2021   BUN 12 03/23/2021   CO2 29 03/23/2021   TSH 2.03 03/23/2021   PSA 0.51 03/23/2021   Assessment/Plan:  Jimmy Hess is a 56 y.o. Black or African American [2] male with  has a past medical history of ALLERGIC RHINITIS (05/16/2007), Allergy, Arthritis, ERECTILE DYSFUNCTION (05/12/2007), GERD (05/16/2007), Glucose 6 phosphatase deficiency (HCC), GLUCOSE-6-PHOSPHATE DEHYDROGENASE DEFICIENCY (05/12/2007), HYPERLIPIDEMIA (09/10/2008), and HYPERTENSION (05/16/2007).  Vitamin D deficiency Last vitamin D Lab Results  Component Value Date   VD25OH 46.16 03/23/2021   Stable, cont oral replacement  Hyperlipidemia Lab Results  Component Value Date   LDLCALC 104 (H) 03/23/2021   Uncontrolled, pt to continue current low chol diet, declines statin for now   Right shoulder pain C/w with probable rotater cuff disorder, for mobic 15 qd prn, and see sports medicine   Upper back pain on right side Also c/w msk  strain , for muscle relaxer prn flexeril 5 mg, and refer sports medicine  Diarrhea Exam benign, ok for celiac lab testing today  Followup: Return if symptoms worsen or fail to improve.  Oliver Barre, MD 02/19/2022 3:43 PM [facility]

## 2022-02-19 ENCOUNTER — Encounter: Payer: Self-pay | Admitting: Internal Medicine

## 2022-02-19 DIAGNOSIS — R197 Diarrhea, unspecified: Secondary | ICD-10-CM | POA: Insufficient documentation

## 2022-02-19 NOTE — Assessment & Plan Note (Signed)
Lab Results  Component Value Date   LDLCALC 104 (H) 03/23/2021   Uncontrolled, pt to continue current low chol diet, declines statin for now

## 2022-02-19 NOTE — Assessment & Plan Note (Signed)
C/w with probable rotater cuff disorder, for mobic 15 qd prn, and see sports medicine

## 2022-02-19 NOTE — Assessment & Plan Note (Signed)
Exam benign, ok for celiac lab testing today

## 2022-02-19 NOTE — Assessment & Plan Note (Signed)
Last vitamin D Lab Results  Component Value Date   VD25OH 46.16 03/23/2021   Stable, cont oral replacement

## 2022-02-19 NOTE — Assessment & Plan Note (Signed)
Also c/w msk strain , for muscle relaxer prn flexeril 5 mg, and refer sports medicine

## 2022-03-11 ENCOUNTER — Other Ambulatory Visit (INDEPENDENT_AMBULATORY_CARE_PROVIDER_SITE_OTHER): Payer: 59

## 2022-03-11 DIAGNOSIS — R197 Diarrhea, unspecified: Secondary | ICD-10-CM

## 2022-03-11 DIAGNOSIS — Z125 Encounter for screening for malignant neoplasm of prostate: Secondary | ICD-10-CM | POA: Diagnosis not present

## 2022-03-11 DIAGNOSIS — E559 Vitamin D deficiency, unspecified: Secondary | ICD-10-CM

## 2022-03-11 DIAGNOSIS — E7849 Other hyperlipidemia: Secondary | ICD-10-CM | POA: Diagnosis not present

## 2022-03-11 DIAGNOSIS — E538 Deficiency of other specified B group vitamins: Secondary | ICD-10-CM | POA: Diagnosis not present

## 2022-03-11 DIAGNOSIS — R739 Hyperglycemia, unspecified: Secondary | ICD-10-CM | POA: Diagnosis not present

## 2022-03-11 LAB — HEPATIC FUNCTION PANEL
ALT: 16 U/L (ref 0–53)
AST: 19 U/L (ref 0–37)
Albumin: 4.6 g/dL (ref 3.5–5.2)
Alkaline Phosphatase: 55 U/L (ref 39–117)
Bilirubin, Direct: 0.1 mg/dL (ref 0.0–0.3)
Total Bilirubin: 0.6 mg/dL (ref 0.2–1.2)
Total Protein: 7.3 g/dL (ref 6.0–8.3)

## 2022-03-11 LAB — BASIC METABOLIC PANEL
BUN: 15 mg/dL (ref 6–23)
CO2: 26 mEq/L (ref 19–32)
Calcium: 9.5 mg/dL (ref 8.4–10.5)
Chloride: 104 mEq/L (ref 96–112)
Creatinine, Ser: 1.14 mg/dL (ref 0.40–1.50)
GFR: 72 mL/min (ref >60.00)
Glucose, Bld: 91 mg/dL (ref 70–99)
Potassium: 4 mEq/L (ref 3.5–5.1)
Sodium: 139 mEq/L (ref 135–145)

## 2022-03-11 LAB — URINALYSIS, ROUTINE W REFLEX MICROSCOPIC
Bilirubin Urine: NEGATIVE
Hgb urine dipstick: NEGATIVE
Ketones, ur: NEGATIVE
Leukocytes,Ua: NEGATIVE
Nitrite: NEGATIVE
RBC / HPF: NONE SEEN (ref 0–?)
Specific Gravity, Urine: 1.01 (ref 1.000–1.030)
Total Protein, Urine: NEGATIVE
Urine Glucose: NEGATIVE
Urobilinogen, UA: 0.2 (ref 0.0–1.0)
WBC, UA: NONE SEEN (ref 0–?)
pH: 6 (ref 5.0–8.0)

## 2022-03-11 LAB — VITAMIN B12: Vitamin B-12: 871 pg/mL (ref 211–911)

## 2022-03-11 LAB — VITAMIN D 25 HYDROXY (VIT D DEFICIENCY, FRACTURES): VITD: 46.47 ng/mL (ref 30.00–100.00)

## 2022-03-11 LAB — CBC WITH DIFFERENTIAL/PLATELET
Basophils Absolute: 0 10*3/uL (ref 0.0–0.1)
Basophils Relative: 0.6 % (ref 0.0–3.0)
Eosinophils Absolute: 0 10*3/uL (ref 0.0–0.7)
Eosinophils Relative: 1.4 % (ref 0.0–5.0)
HCT: 41.3 % (ref 39.0–52.0)
Hemoglobin: 14.1 g/dL (ref 13.0–17.0)
Lymphocytes Relative: 38.5 % (ref 12.0–46.0)
Lymphs Abs: 1.1 10*3/uL (ref 0.7–4.0)
MCHC: 34.2 g/dL (ref 30.0–36.0)
MCV: 94.5 fl (ref 78.0–100.0)
Monocytes Absolute: 0.3 10*3/uL (ref 0.1–1.0)
Monocytes Relative: 10.3 % (ref 3.0–12.0)
Neutro Abs: 1.4 10*3/uL (ref 1.4–7.7)
Neutrophils Relative %: 49.2 % (ref 43.0–77.0)
Platelets: 162 10*3/uL (ref 150.0–400.0)
RBC: 4.38 Mil/uL (ref 4.22–5.81)
RDW: 12.4 % (ref 11.5–15.5)
WBC: 2.7 10*3/uL — ABNORMAL LOW (ref 4.0–10.5)

## 2022-03-11 LAB — TSH: TSH: 0.87 u[IU]/mL (ref 0.35–5.50)

## 2022-03-11 LAB — LIPID PANEL
Cholesterol: 178 mg/dL (ref 0–200)
HDL: 50 mg/dL (ref >39.00)
LDL Cholesterol: 103 mg/dL — ABNORMAL HIGH (ref 0–99)
NonHDL: 127.82
Total CHOL/HDL Ratio: 4
Triglycerides: 123 mg/dL (ref 0.0–149.0)
VLDL: 24.6 mg/dL (ref 0.0–40.0)

## 2022-03-11 LAB — HEMOGLOBIN A1C: Hgb A1c MFr Bld: 4.9 % (ref 4.6–6.5)

## 2022-03-11 LAB — PSA: PSA: 0.62 ng/mL (ref 0.10–4.00)

## 2022-03-12 LAB — GLIADIN ANTIBODIES, SERUM
Gliadin IgA: <1 U/mL
Gliadin IgG: 54.7 U/mL — ABNORMAL HIGH

## 2022-03-14 ENCOUNTER — Other Ambulatory Visit: Payer: Self-pay | Admitting: Internal Medicine

## 2022-03-14 ENCOUNTER — Encounter: Payer: Self-pay | Admitting: Internal Medicine

## 2022-03-14 DIAGNOSIS — R894 Abnormal immunological findings in specimens from other organs, systems and tissues: Secondary | ICD-10-CM

## 2022-03-15 ENCOUNTER — Telehealth: Payer: Self-pay

## 2022-03-15 NOTE — Telephone Encounter (Signed)
Pt return call for his results I advised the pt of Dr. Melvyn Novas result note stating The test results show that your current treatment is OK, as the tests show an elevated antibody that is consistent with celiac disease.  I will refer to Gastroenterology for further consideration.  There is no other need for change of treatment or further evaluation based on these results, at this time.  thanks    I will do referral  Pt understood and agreed with the referral.  FYI

## 2022-03-24 ENCOUNTER — Ambulatory Visit (INDEPENDENT_AMBULATORY_CARE_PROVIDER_SITE_OTHER): Payer: 59 | Admitting: Internal Medicine

## 2022-03-24 ENCOUNTER — Encounter: Payer: Self-pay | Admitting: Internal Medicine

## 2022-03-24 VITALS — BP 114/62 | HR 81 | Temp 98.8°F | Ht 72.0 in | Wt 224.0 lb

## 2022-03-24 DIAGNOSIS — Z800 Family history of malignant neoplasm of digestive organs: Secondary | ICD-10-CM

## 2022-03-24 DIAGNOSIS — E7800 Pure hypercholesterolemia, unspecified: Secondary | ICD-10-CM | POA: Diagnosis not present

## 2022-03-24 DIAGNOSIS — Z0001 Encounter for general adult medical examination with abnormal findings: Secondary | ICD-10-CM | POA: Diagnosis not present

## 2022-03-24 DIAGNOSIS — R197 Diarrhea, unspecified: Secondary | ICD-10-CM

## 2022-03-24 DIAGNOSIS — I10 Essential (primary) hypertension: Secondary | ICD-10-CM

## 2022-03-24 DIAGNOSIS — E559 Vitamin D deficiency, unspecified: Secondary | ICD-10-CM | POA: Diagnosis not present

## 2022-03-24 NOTE — Assessment & Plan Note (Signed)
Last vitamin D Lab Results  Component Value Date   VD25OH 46.47 03/11/2022   Stable, cont oral replacement

## 2022-03-24 NOTE — Progress Notes (Signed)
Patient ID: Jimmy Hess, male   DOB: 09-Oct-1965, 56 y.o.   MRN: [id number]         Chief Complaint:: wellness exam and diarrhea, gerd, hld, low vit d       HPI:  Jimmy Hess is a 56 y.o. male here for wellness exam; due for colonoscopy, o/w declines covid booster, o/w up to date                        Also has persistent diarrhea, recent serology + for celiac antibody.  Also has mild reflux but managed well with TUMs prn and anti-reflux diet.  Denies worsening abd pain, dysphagia, n/v, or blood.  Pt denies chest pain, increased sob or doe, wheezing, orthopnea, PND, increased LE swelling, palpitations, dizziness or syncope.   Pt denies polydipsia, polyuria, or new focal neuro s/s.   Taking Vit D.     Wt Readings from Last 3 Encounters:  03/24/22 224 lb (101.6 kg)  02/16/22 226 lb 4.8 oz (102.6 kg)  03/23/21 221 lb 6.4 oz (100.4 kg)   BP Readings from Last 3 Encounters:  03/24/22 114/62  02/16/22 120/78  03/23/21 130/80   Immunization History  Administered Date(s) Administered   PFIZER(Purple Top)SARS-COV-2 Vaccination 11/09/2019, 11/30/2019   Td 09/05/1997, 09/10/2008   Tdap 03/23/2021   Zoster Recombinat (Shingrix) 02/18/2022   There are no preventive care reminders to display for this patient.     Past Medical History:  Diagnosis Date   ALLERGIC RHINITIS 05/16/2007   Qualifier: Diagnosis of  By: Jonny Ruiz MD, Len Blalock    Allergy    Arthritis    ERECTILE DYSFUNCTION 05/12/2007   Qualifier: Diagnosis of  By: Jonny Ruiz MD, Len Blalock    GERD 05/16/2007   Qualifier: Diagnosis of  By: Jonny Ruiz MD, Len Blalock    Glucose 6 phosphatase deficiency (HCC)    GLUCOSE-6-PHOSPHATE DEHYDROGENASE DEFICIENCY 05/12/2007   Qualifier: Diagnosis of  By: Maris Berger    HYPERLIPIDEMIA 09/10/2008   Qualifier: Diagnosis of  By: Jonny Ruiz MD, Len Blalock    HYPERTENSION 05/16/2007   Qualifier: Diagnosis of  By: Jonny Ruiz MD, Len Blalock; HTN meds discontinued by provider   Past Surgical History:  Procedure Laterality  Date   FRACTURE SURGERY     ankle repair   KNEE SURGERY     NASAL SEPTUM SURGERY      reports that he has quit smoking. He has never used smokeless tobacco. He reports current alcohol use. He reports that he does not use drugs. family history includes Heart failure in his maternal grandmother; Hypertension in his maternal grandmother and mother; Prostate cancer in his father. Allergies  Allergen Reactions   Aspirin Other (See Comments)    Cannot have due to blood deficiency   Sulfonamide Derivatives Other (See Comments)    Patient unsure   Current Outpatient Medications on File Prior to Visit  Medication Sig Dispense Refill   albuterol (VENTOLIN HFA) 108 (90 Base) MCG/ACT inhaler Inhale 2 puffs into the lungs every 6 (six) hours as needed for wheezing or shortness of breath. 1 each 5   Amino Acids (AMINO ACID PO) Take 1 tablet by mouth daily.     Cholecalciferol 50 MCG (2000 UT) TABS 1 tab by mouth once daily 30 tablet 99   cyclobenzaprine (FLEXERIL) 5 MG tablet Take 1 tablet (5 mg total) by mouth 3 (three) times daily as needed. 40 tablet 1   gabapentin (NEURONTIN) 100 MG capsule  Take 1 capsule (100 mg total) by mouth 3 (three) times daily. 90 capsule 5   losartan (COZAAR) 50 MG tablet 1 tab by mouth once daily 90 tablet 1   meloxicam (MOBIC) 15 MG tablet Take 1 tablet (15 mg total) by mouth daily as needed for pain. As needed for pain 90 tablet 3   Multiple Vitamin (MULTIVITAMIN) tablet Take 1 tablet by mouth daily.     No current facility-administered medications on file prior to visit.        ROS:  All others reviewed and negative.  Objective        PE:  BP 114/62 (BP Location: Left Arm, Patient Position: Sitting, Cuff Size: Large)   Pulse 81   Temp 98.8 F (37.1 C) (Oral)   Ht 6' (1.829 m)   Wt 224 lb (101.6 kg)   SpO2 94%   BMI 30.38 kg/m                 Constitutional: Pt appears in NAD               HENT: Head: NCAT.                Right Ear: External ear normal.                  Left Ear: External ear normal.                Eyes: . Pupils are equal, round, and reactive to light. Conjunctivae and EOM are normal               Nose: without d/c or deformity               Neck: Neck supple. Gross normal ROM               Cardiovascular: Normal rate and regular rhythm.                 Pulmonary/Chest: Effort normal and breath sounds without rales or wheezing.                Abd:  Soft, NT, ND, + BS, no organomegaly               Neurological: Pt is alert. At baseline orientation, motor grossly intact               Skin: Skin is warm. No rashes, no other new lesions, LE edema - none               Psychiatric: Pt behavior is normal without agitation   Micro: none  Cardiac tracings I have personally interpreted today:  none  Pertinent Radiological findings (summarize): none   Lab Results  Component Value Date   WBC 2.7 (L) 03/11/2022   HGB 14.1 03/11/2022   HCT 41.3 03/11/2022   PLT 162.0 03/11/2022   GLUCOSE 91 03/11/2022   CHOL 178 03/11/2022   TRIG 123.0 03/11/2022   HDL 50.00 03/11/2022   LDLCALC 103 (H) 03/11/2022   ALT 16 03/11/2022   AST 19 03/11/2022   NA 139 03/11/2022   K 4.0 03/11/2022   CL 104 03/11/2022   CREATININE 1.14 03/11/2022   BUN 15 03/11/2022   CO2 26 03/11/2022   TSH 0.87 03/11/2022   PSA 0.62 03/11/2022   HGBA1C 4.9 03/11/2022   Assessment/Plan:  Jimmy Hess is a 56 y.o. Black or African American [2] male with  has a past medical history  of ALLERGIC RHINITIS (05/16/2007), Allergy, Arthritis, ERECTILE DYSFUNCTION (05/12/2007), GERD (05/16/2007), Glucose 6 phosphatase deficiency (HCC), GLUCOSE-6-PHOSPHATE DEHYDROGENASE DEFICIENCY (05/12/2007), HYPERLIPIDEMIA (09/10/2008), and HYPERTENSION (05/16/2007).  Vitamin D deficiency Last vitamin D Lab Results  Component Value Date   VD25OH 46.47 03/11/2022   Stable, cont oral replacement   Encounter for well adult exam with abnormal findings Age and sex appropriate  education and counseling updated with regular exercise and diet Referrals for preventative services - for colonoscopy Immunizations addressed - declines covid booster Smoking counseling  - none needed Evidence for depression or other mood disorder - none significant Most recent labs reviewed. I have personally reviewed and have noted: 1) the patient's medical and social history 2) The patient's current medications and supplements 3) The patient's height, weight, and BMI have been recorded in the chart   Hyperlipidemia Lab Results  Component Value Date   LDLCALC 103 (H) 03/11/2022   uncontrolled, pt declines statin for now   Essential hypertension BP Readings from Last 3 Encounters:  03/24/22 114/62  02/16/22 120/78  03/23/21 130/80   Stable, pt to continue medical treatment losartan 50 mg qd   Family history of colon cancer Also due for f/u colonoscopy  Diarrhea With possible celiac dz, for referral GI  Followup: Return in about 1 year (around 03/25/2023).  Oliver Barre, MD 03/26/2022 9:05 PM [facility] [place] [facility]

## 2022-03-24 NOTE — Patient Instructions (Addendum)

## 2022-03-26 ENCOUNTER — Encounter: Payer: Self-pay | Admitting: Internal Medicine

## 2022-03-26 NOTE — Assessment & Plan Note (Signed)
Age and sex appropriate education and counseling updated with regular exercise and diet Referrals for preventative services - for colonoscopy Immunizations addressed - declines covid booster Smoking counseling  - none needed Evidence for depression or other mood disorder - none significant Most recent labs reviewed. I have personally reviewed and have noted: 1) the patient's medical and social history 2) The patient's current medications and supplements 3) The patient's height, weight, and BMI have been recorded in the chart  

## 2022-03-26 NOTE — Assessment & Plan Note (Signed)
Also due for f/u colonoscopy 

## 2022-03-26 NOTE — Assessment & Plan Note (Signed)
Lab Results  Component Value Date   LDLCALC 103 (H) 03/11/2022   uncontrolled, pt declines statin for now

## 2022-03-26 NOTE — Assessment & Plan Note (Signed)
With possible celiac dz, for referral GI

## 2022-03-26 NOTE — Assessment & Plan Note (Signed)
BP Readings from Last 3 Encounters:  03/24/22 114/62  02/16/22 120/78  03/23/21 130/80   Stable, pt to continue medical treatment losartan 50 mg qd

## 2022-05-31 ENCOUNTER — Other Ambulatory Visit: Payer: Self-pay | Admitting: Internal Medicine

## 2022-05-31 NOTE — Telephone Encounter (Signed)
Please refill as per office routine med refill policy (all routine meds to be refilled for 3 mo or monthly (per pt preference) up to one year from last visit, then month to month grace period for 3 mo, then further med refills will have to be denied) ? ?

## 2022-07-14 ENCOUNTER — Encounter: Payer: Self-pay | Admitting: Internal Medicine

## 2022-09-02 ENCOUNTER — Ambulatory Visit: Payer: 59 | Admitting: Internal Medicine

## 2022-11-25 ENCOUNTER — Ambulatory Visit: Payer: 59 | Admitting: Internal Medicine

## 2022-11-25 ENCOUNTER — Encounter: Payer: Self-pay | Admitting: Internal Medicine

## 2022-11-25 VITALS — BP 120/82 | HR 83 | Temp 99.1°F | Ht 72.0 in | Wt 226.0 lb

## 2022-11-25 DIAGNOSIS — J309 Allergic rhinitis, unspecified: Secondary | ICD-10-CM | POA: Diagnosis not present

## 2022-11-25 DIAGNOSIS — R6889 Other general symptoms and signs: Secondary | ICD-10-CM | POA: Diagnosis not present

## 2022-11-25 DIAGNOSIS — I10 Essential (primary) hypertension: Secondary | ICD-10-CM | POA: Diagnosis not present

## 2022-11-25 DIAGNOSIS — J019 Acute sinusitis, unspecified: Secondary | ICD-10-CM

## 2022-11-25 LAB — POCT INFLUENZA A/B
Influenza A, POC: NEGATIVE
Influenza B, POC: NEGATIVE

## 2022-11-25 LAB — POC COVID19 BINAXNOW: SARS Coronavirus 2 Ag: NEGATIVE

## 2022-11-25 LAB — POCT RESPIRATORY SYNCYTIAL VIRUS: RSV Rapid Ag: NEGATIVE

## 2022-11-25 MED ORDER — DOXYCYCLINE HYCLATE 100 MG PO TABS: 100.0000 mg | ORAL_TABLET | Freq: Two times a day (BID) | ORAL | 0 refills | Status: DC

## 2022-11-25 MED ORDER — PREDNISONE 10 MG PO TABS: ORAL_TABLET | ORAL | 0 refills | Status: DC

## 2022-11-25 NOTE — Patient Instructions (Signed)
Please take all new medication as prescribed  - the antibiotic, and prednisone  You can also take Delsym OTC for cough, and/or Mucinex (or it's generic off brand) for congestion, and tylenol as needed for pain.  Please continue all other medications as before, and refills have been done if requested.  Please have the pharmacy call with any other refills you may need.  Please keep your appointments with your specialists as you may have planned     

## 2022-11-25 NOTE — Progress Notes (Unsigned)
Patient ID: Jimmy Hess, male   DOB: 03-09-1966, 57 y.o.   MRN: [id number]        Chief Complaint: follow up sinus pain and congestion, htn       HPI:  Jimmy Hess is a 57 y.o. male  Here with 2-3 days acute onset fever, facial pain, pressure, headache, general weakness and malaise, and greenish d/c, with mild ST and cough, but pt denies chest pain, wheezing, increased sob or doe, orthopnea, PND, increased LE swelling, palpitations, dizziness or syncope.  Does have several wks ongoing nasal allergy symptoms with clearish congestion, itch and sneezing, without fever, pain, ST, cough, swelling or wheezing.         Wt Readings from Last 3 Encounters:  11/25/22 226 lb (102.5 kg)  03/24/22 224 lb (101.6 kg)  02/16/22 226 lb 4.8 oz (102.6 kg)   BP Readings from Last 3 Encounters:  11/25/22 120/82  03/24/22 114/62  02/16/22 120/78         Past Medical History:  Diagnosis Date   ALLERGIC RHINITIS 05/16/2007   Qualifier: Diagnosis of  By: Jenny Reichmann MD, Hunt Oris    Allergy    Arthritis    ERECTILE DYSFUNCTION 05/12/2007   Qualifier: Diagnosis of  By: Jenny Reichmann MD, Hunt Oris    GERD 05/16/2007   Qualifier: Diagnosis of  By: Jenny Reichmann MD, Hunt Oris    Glucose 6 phosphatase deficiency ([place])    GLUCOSE-6-PHOSPHATE DEHYDROGENASE DEFICIENCY 05/12/2007   Qualifier: Diagnosis of  By: Elveria Royals    HYPERLIPIDEMIA 09/10/2008   Qualifier: Diagnosis of  By: Jenny Reichmann MD, Hunt Oris    HYPERTENSION 05/16/2007   Qualifier: Diagnosis of  By: Jenny Reichmann MD, Hunt Oris; HTN meds discontinued by provider   Past Surgical History:  Procedure Laterality Date   FRACTURE SURGERY     ankle repair   KNEE SURGERY     NASAL SEPTUM SURGERY      reports that he has quit smoking. He has never used smokeless tobacco. He reports current alcohol use. He reports that he does not use drugs. family history includes Heart failure in his maternal grandmother; Hypertension in his maternal grandmother and mother; Prostate cancer in his  father. Allergies  Allergen Reactions   Aspirin Other (See Comments)    Cannot have due to blood deficiency   Sulfonamide Derivatives Other (See Comments)    Patient unsure   Current Outpatient Medications on File Prior to Visit  Medication Sig Dispense Refill   albuterol (VENTOLIN HFA) 108 (90 Base) MCG/ACT inhaler Inhale 2 puffs into the lungs every 6 (six) hours as needed for wheezing or shortness of breath. 1 each 5   Amino Acids (AMINO ACID PO) Take 1 tablet by mouth daily.     Cholecalciferol 50 MCG (2000 UT) TABS 1 tab by mouth once daily 30 tablet 99   cyclobenzaprine (FLEXERIL) 5 MG tablet Take 1 tablet (5 mg total) by mouth 3 (three) times daily as needed. 40 tablet 1   gabapentin (NEURONTIN) 100 MG capsule Take 1 capsule (100 mg total) by mouth 3 (three) times daily. 90 capsule 5   losartan (COZAAR) 50 MG tablet TAKE 1 TABLET BY MOUTH EVERY DAY 30 tablet 5   meloxicam (MOBIC) 15 MG tablet Take 1 tablet (15 mg total) by mouth daily as needed for pain. As needed for pain 90 tablet 3   Multiple Vitamin (MULTIVITAMIN) tablet Take 1 tablet by mouth daily.     No current facility-administered medications on  file prior to visit.        ROS:  All others reviewed and negative.  Objective        PE:  BP 120/82 (BP Location: Right Arm, Patient Position: Sitting, Cuff Size: Normal)   Pulse 83   Temp 99.1 F (37.3 C) (Oral)   Ht 6' (1.829 m)   Wt 226 lb (102.5 kg)   SpO2 98%   BMI 30.65 kg/m                 Constitutional: Pt appears in NAD               HENT: Head: NCAT.                Right Ear: External ear normal.                 Left Ear: External ear normal. Bilat tm's with mild erythema.  Max sinus areas mild tender.  Pharynx with mild erythema, no exudate               Eyes: . Pupils are equal, round, and reactive to light. Conjunctivae and EOM are normal               Nose: without d/c or deformity               Neck: Neck supple. Gross normal ROM                Cardiovascular: Normal rate and regular rhythm.                 Pulmonary/Chest: Effort normal and breath sounds without rales or wheezing.                               Neurological: Pt is alert. At baseline orientation, motor grossly intact               Skin: Skin is warm. No rashes, no other new lesions, LE edema - none               Psychiatric: Pt behavior is normal without agitation   Micro: none  Cardiac tracings I have personally interpreted today:  none  Pertinent Radiological findings (summarize): none   Lab Results  Component Value Date   WBC 2.7 (L) 03/11/2022   HGB 14.1 03/11/2022   HCT 41.3 03/11/2022   PLT 162.0 03/11/2022   GLUCOSE 91 03/11/2022   CHOL 178 03/11/2022   TRIG 123.0 03/11/2022   HDL 50.00 03/11/2022   LDLCALC 103 (H) 03/11/2022   ALT 16 03/11/2022   AST 19 03/11/2022   NA 139 03/11/2022   K 4.0 03/11/2022   CL 104 03/11/2022   CREATININE 1.14 03/11/2022   BUN 15 03/11/2022   CO2 26 03/11/2022   TSH 0.87 03/11/2022   PSA 0.62 03/11/2022   HGBA1C 4.9 03/11/2022   POCT - Covid  - neg, Flu - neg, RSV - neg  Assessment/Plan:  Jimmy Hess is a 57 y.o. Black or African American [2] male with  has a past medical history of ALLERGIC RHINITIS (05/16/2007), Allergy, Arthritis, ERECTILE DYSFUNCTION (05/12/2007), GERD (05/16/2007), Glucose 6 phosphatase deficiency ([place]), GLUCOSE-6-PHOSPHATE DEHYDROGENASE DEFICIENCY (05/12/2007), HYPERLIPIDEMIA (09/10/2008), and HYPERTENSION (05/16/2007).  Acute sinus infection Mild to mod, for antibx course doxycycline 100 bid,  to f/u any worsening symptoms or concerns  Allergic rhinitis Mild to mod, likely seasonal flare, for prednisone  taper,  to f/u any worsening symptoms or concerns  Essential hypertension BP Readings from Last 3 Encounters:  11/25/22 120/82  03/24/22 114/62  02/16/22 120/78   Stable, pt to continue medical treatment  losartan 50 mg qd  Followup: Return if symptoms worsen or fail to  improve.  Cathlean Cower, MD 11/27/2022 4:21 PM [facility]

## 2022-11-27 ENCOUNTER — Encounter: Payer: Self-pay | Admitting: Internal Medicine

## 2022-11-27 NOTE — Assessment & Plan Note (Signed)
Mild to mod, for antibx course doxycycline 100 bid,  to f/u any worsening symptoms or concerns 

## 2022-11-27 NOTE — Assessment & Plan Note (Signed)
Mild to mod, likely seasonal flare, for prednisone taper,  to f/u any worsening symptoms or concerns

## 2022-11-27 NOTE — Assessment & Plan Note (Signed)
BP Readings from Last 3 Encounters:  11/25/22 120/82  03/24/22 114/62  02/16/22 120/78   Stable, pt to continue medical treatment  losartan 50 mg qd

## 2022-12-05 ENCOUNTER — Encounter: Payer: Self-pay | Admitting: Internal Medicine

## 2022-12-05 ENCOUNTER — Ambulatory Visit: Payer: 59 | Admitting: Internal Medicine

## 2022-12-05 ENCOUNTER — Ambulatory Visit (INDEPENDENT_AMBULATORY_CARE_PROVIDER_SITE_OTHER): Payer: 59

## 2022-12-05 VITALS — BP 122/80 | HR 95 | Temp 98.9°F | Ht 72.0 in | Wt 225.0 lb

## 2022-12-05 DIAGNOSIS — J309 Allergic rhinitis, unspecified: Secondary | ICD-10-CM

## 2022-12-05 DIAGNOSIS — R739 Hyperglycemia, unspecified: Secondary | ICD-10-CM | POA: Diagnosis not present

## 2022-12-05 DIAGNOSIS — R400 Somnolence: Secondary | ICD-10-CM

## 2022-12-05 DIAGNOSIS — R0600 Dyspnea, unspecified: Secondary | ICD-10-CM | POA: Diagnosis not present

## 2022-12-05 DIAGNOSIS — E7800 Pure hypercholesterolemia, unspecified: Secondary | ICD-10-CM

## 2022-12-05 DIAGNOSIS — Z125 Encounter for screening for malignant neoplasm of prostate: Secondary | ICD-10-CM

## 2022-12-05 DIAGNOSIS — J3089 Other allergic rhinitis: Secondary | ICD-10-CM | POA: Diagnosis not present

## 2022-12-05 DIAGNOSIS — E538 Deficiency of other specified B group vitamins: Secondary | ICD-10-CM | POA: Diagnosis not present

## 2022-12-05 DIAGNOSIS — I10 Essential (primary) hypertension: Secondary | ICD-10-CM

## 2022-12-05 DIAGNOSIS — Z0001 Encounter for general adult medical examination with abnormal findings: Secondary | ICD-10-CM

## 2022-12-05 DIAGNOSIS — E559 Vitamin D deficiency, unspecified: Secondary | ICD-10-CM | POA: Diagnosis not present

## 2022-12-05 LAB — HEPATIC FUNCTION PANEL
ALT: 14 U/L (ref 0–53)
AST: 16 U/L (ref 0–37)
Albumin: 4.6 g/dL (ref 3.5–5.2)
Alkaline Phosphatase: 70 U/L (ref 39–117)
Bilirubin, Direct: 0.1 mg/dL (ref 0.0–0.3)
Total Bilirubin: 0.7 mg/dL (ref 0.2–1.2)
Total Protein: 7.4 g/dL (ref 6.0–8.3)

## 2022-12-05 LAB — PSA: PSA: 0.49 ng/mL (ref 0.10–4.00)

## 2022-12-05 LAB — LIPID PANEL
Cholesterol: 207 mg/dL — ABNORMAL HIGH (ref 0–200)
HDL: 60.2 mg/dL (ref >39.00)
LDL Cholesterol: 121 mg/dL — ABNORMAL HIGH (ref 0–99)
NonHDL: 146.53
Total CHOL/HDL Ratio: 3
Triglycerides: 129 mg/dL (ref 0.0–149.0)
VLDL: 25.8 mg/dL (ref 0.0–40.0)

## 2022-12-05 LAB — URINALYSIS, ROUTINE W REFLEX MICROSCOPIC
Bilirubin Urine: NEGATIVE
Hgb urine dipstick: NEGATIVE
Ketones, ur: NEGATIVE
Leukocytes,Ua: NEGATIVE
Nitrite: NEGATIVE
RBC / HPF: NONE SEEN (ref 0–?)
Specific Gravity, Urine: 1.01 (ref 1.000–1.030)
Total Protein, Urine: NEGATIVE
Urine Glucose: NEGATIVE
Urobilinogen, UA: 0.2 (ref 0.0–1.0)
WBC, UA: NONE SEEN (ref 0–?)
pH: 6 (ref 5.0–8.0)

## 2022-12-05 LAB — CBC WITH DIFFERENTIAL/PLATELET
Basophils Absolute: 0 10*3/uL (ref 0.0–0.1)
Basophils Relative: 0.4 % (ref 0.0–3.0)
Eosinophils Absolute: 0 10*3/uL (ref 0.0–0.7)
Eosinophils Relative: 0.3 % (ref 0.0–5.0)
HCT: 44.8 % (ref 39.0–52.0)
Hemoglobin: 15.4 g/dL (ref 13.0–17.0)
Lymphocytes Relative: 20.6 % (ref 12.0–46.0)
Lymphs Abs: 1.2 10*3/uL (ref 0.7–4.0)
MCHC: 34.5 g/dL (ref 30.0–36.0)
MCV: 93.3 fl (ref 78.0–100.0)
Monocytes Absolute: 0.5 10*3/uL (ref 0.1–1.0)
Monocytes Relative: 8.2 % (ref 3.0–12.0)
Neutro Abs: 4.1 10*3/uL (ref 1.4–7.7)
Neutrophils Relative %: 70.5 % (ref 43.0–77.0)
Platelets: 185 10*3/uL (ref 150.0–400.0)
RBC: 4.8 Mil/uL (ref 4.22–5.81)
RDW: 12.3 % (ref 11.5–15.5)
WBC: 5.8 10*3/uL (ref 4.0–10.5)

## 2022-12-05 LAB — BASIC METABOLIC PANEL WITH GFR
BUN: 16 mg/dL (ref 6–23)
CO2: 30 meq/L (ref 19–32)
Calcium: 10.3 mg/dL (ref 8.4–10.5)
Chloride: 102 meq/L (ref 96–112)
Creatinine, Ser: 1.1 mg/dL (ref 0.40–1.50)
GFR: 74.77 mL/min
Glucose, Bld: 85 mg/dL (ref 70–99)
Potassium: 4.5 meq/L (ref 3.5–5.1)
Sodium: 137 meq/L (ref 135–145)

## 2022-12-05 LAB — VITAMIN B12: Vitamin B-12: 898 pg/mL (ref 211–911)

## 2022-12-05 LAB — VITAMIN D 25 HYDROXY (VIT D DEFICIENCY, FRACTURES): VITD: 36.13 ng/mL (ref 30.00–100.00)

## 2022-12-05 LAB — TSH: TSH: 0.99 u[IU]/mL (ref 0.35–5.50)

## 2022-12-05 LAB — HEMOGLOBIN A1C: Hgb A1c MFr Bld: 4.9 % (ref 4.6–6.5)

## 2022-12-05 MED ORDER — METHYLPREDNISOLONE ACETATE 80 MG/ML IJ SUSP
80.0000 mg | Freq: Once | INTRAMUSCULAR | Status: AC
Start: 2022-12-05 — End: 2022-12-05
  Administered 2022-12-05: 80 mg via INTRAMUSCULAR

## 2022-12-05 NOTE — Assessment & Plan Note (Signed)
Mild to mod, for depomedrol 80 mg IM, to f/u any worsening symptoms or concerns 

## 2022-12-05 NOTE — Assessment & Plan Note (Signed)
Last vitamin D Lab Results  Component Value Date   VD25OH 36.13 12/05/2022   Low, to start oral replacement

## 2022-12-05 NOTE — Assessment & Plan Note (Signed)
Age and sex appropriate education and counseling updated with regular exercise and diet Referrals for preventative services - declines colonoscopy for now Immunizations addressed - declines covid booster Smoking counseling  - none needed Evidence for depression or other mood disorder - none significant Most recent labs reviewed. I have personally reviewed and have noted: 1) the patient's medical and social history 2) The patient's current medications and supplements 3) The patient's height, weight, and BMI have been recorded in the chart

## 2022-12-05 NOTE — Assessment & Plan Note (Signed)
Also for pulm referral, r/o osa 

## 2022-12-05 NOTE — Assessment & Plan Note (Signed)
BP Readings from Last 3 Encounters:  12/05/22 122/80  11/25/22 120/82  03/24/22 114/62   Stable, pt to continue medical treatment losartan 50 mg qd

## 2022-12-05 NOTE — Assessment & Plan Note (Signed)
1 min severe episode last wk, exam benign, no recurrence, I suspect mucous plug related, ok for mucinex bid prn

## 2022-12-05 NOTE — Progress Notes (Signed)
Patient ID: Jimmy Hess, male   DOB: [birth year], 57 y.o.   MRN: [id number]         Chief Complaint:: wellness exam and allergies, dyspnea severe brief episode, daytime somnolence, hld, low vit d       HPI:  Jimmy Hess is a 57 y.o. male here for wellness exam; decliens covid booster and colonoscopy for now, o/w up to date                        Also Pt denies chest pain, increased sob or doe, wheezing, orthopnea, PND, increased LE swelling, palpitations, dizziness or syncope, except Does have several wks ongoing nasal allergy symptoms with clearish congestion, itch and sneezing, without fever, pain, ST, cough, swelling or wheezing except one episode of 1 min marked dyspnea sob just couldn't breathe last wk, no recurrence.   Pt denies polydipsia, polyuria, or new focal neuro s/s.   Pt denies fever, wt loss, night sweats, loss of appetite, or other constitutional symptoms .  Also wife states his breathing pauses at night and he has worsening daytime somnolence in the last 2-3 mo despite no wt change.      Wt Readings from Last 3 Encounters:  12/05/22 225 lb (102.1 kg)  11/25/22 226 lb (102.5 kg)  03/24/22 224 lb (101.6 kg)   BP Readings from Last 3 Encounters:  12/05/22 122/80  11/25/22 120/82  03/24/22 114/62   Immunization History  Administered Date(s) Administered   PFIZER(Purple Top)SARS-COV-2 Vaccination 11/09/2019, 11/30/2019   Td 09/05/1997, 09/10/2008   Tdap 03/23/2021   Zoster Recombinat (Shingrix) 02/18/2022, 06/04/2022  There are no preventive care reminders to display for this patient.    Past Medical History:  Diagnosis Date   ALLERGIC RHINITIS 05/16/2007   Qualifier: Diagnosis of  By: Jenny Reichmann MD, Hunt Oris    Allergy    Arthritis    ERECTILE DYSFUNCTION 05/12/2007   Qualifier: Diagnosis of  By: Jenny Reichmann MD, Hunt Oris    GERD 05/16/2007   Qualifier: Diagnosis of  By: Jenny Reichmann MD, Hunt Oris    Glucose 6 phosphatase deficiency    GLUCOSE-6-PHOSPHATE DEHYDROGENASE DEFICIENCY 05/12/2007    Qualifier: Diagnosis of  By: Elveria Royals    HYPERLIPIDEMIA 09/10/2008   Qualifier: Diagnosis of  By: Jenny Reichmann MD, Hunt Oris    HYPERTENSION 05/16/2007   Qualifier: Diagnosis of  By: Jenny Reichmann MD, Hunt Oris; HTN meds discontinued by provider   Past Surgical History:  Procedure Laterality Date   FRACTURE SURGERY     ankle repair   KNEE SURGERY     NASAL SEPTUM SURGERY      reports that he has quit smoking. He has never used smokeless tobacco. He reports current alcohol use. He reports that he does not use drugs. family history includes Heart failure in his maternal grandmother; Hypertension in his maternal grandmother and mother; Prostate cancer in his father. Allergies  Allergen Reactions   Aspirin Other (See Comments)    Cannot have due to blood deficiency   Sulfonamide Derivatives Other (See Comments)    Patient unsure   Current Outpatient Medications on File Prior to Visit  Medication Sig Dispense Refill   albuterol (VENTOLIN HFA) 108 (90 Base) MCG/ACT inhaler Inhale 2 puffs into the lungs every 6 (six) hours as needed for wheezing or shortness of breath. 1 each 5   Cholecalciferol 50 MCG (2000 UT) TABS 1 tab by mouth once daily 30 tablet 99   gabapentin (NEURONTIN)  100 MG capsule Take 1 capsule (100 mg total) by mouth 3 (three) times daily. 90 capsule 5   losartan (COZAAR) 50 MG tablet TAKE 1 TABLET BY MOUTH EVERY DAY 30 tablet 5   Multiple Vitamin (MULTIVITAMIN) tablet Take 1 tablet by mouth daily.     Amino Acids (AMINO ACID PO) Take 1 tablet by mouth daily. (Patient not taking: Reported on 12/05/2022)     cyclobenzaprine (FLEXERIL) 5 MG tablet Take 1 tablet (5 mg total) by mouth 3 (three) times daily as needed. (Patient not taking: Reported on 12/05/2022) 40 tablet 1   meloxicam (MOBIC) 15 MG tablet Take 1 tablet (15 mg total) by mouth daily as needed for pain. As needed for pain (Patient not taking: Reported on 12/05/2022) 90 tablet 3   No current facility-administered medications on  file prior to visit.        ROS:  All others reviewed and negative.  Objective        PE:  BP 122/80 (BP Location: Left Arm, Patient Position: Sitting, Cuff Size: Normal)   Pulse 95   Temp 98.9 F (37.2 C) (Oral)   Ht 6' (1.829 m)   Wt 225 lb (102.1 kg)   SpO2 95%   BMI 30.52 kg/m                 Constitutional: Pt appears in NAD               HENT: Head: NCAT.                Right Ear: External ear normal.                 Left Ear: External ear normal.                Eyes: . Pupils are equal, round, and reactive to light. Conjunctivae and EOM are normal               Nose: without d/c or deformity               Neck: Neck supple. Gross normal ROM               Cardiovascular: Normal rate and regular rhythm.                 Pulmonary/Chest: Effort normal and breath sounds without rales or wheezing.                Abd:  Soft, NT, ND, + BS, no organomegaly               Neurological: Pt is alert. At baseline orientation, motor grossly intact               Skin: Skin is warm. No rashes, no other new lesions, LE edema - none               Psychiatric: Pt behavior is normal without agitation   Micro: none  Cardiac tracings I have personally interpreted today:  none  Pertinent Radiological findings (summarize): none   Lab Results  Component Value Date   WBC 5.8 12/05/2022   HGB 15.4 12/05/2022   HCT 44.8 12/05/2022   PLT 185.0 12/05/2022   GLUCOSE 85 12/05/2022   CHOL 207 (H) 12/05/2022   TRIG 129.0 12/05/2022   HDL 60.20 12/05/2022   LDLCALC 121 (H) 12/05/2022   ALT 14 12/05/2022   AST 16 12/05/2022   NA 137 12/05/2022   K  4.5 12/05/2022   CL 102 12/05/2022   CREATININE 1.10 12/05/2022   BUN 16 12/05/2022   CO2 30 12/05/2022   TSH 0.99 12/05/2022   PSA 0.49 12/05/2022   HGBA1C 4.9 12/05/2022   Assessment/Plan:  Jimmy Hess is a 57 y.o. Black or African American [2] male with  has a past medical history of ALLERGIC RHINITIS (05/16/2007), Allergy, Arthritis,  ERECTILE DYSFUNCTION (05/12/2007), GERD (05/16/2007), Glucose 6 phosphatase deficiency, GLUCOSE-6-PHOSPHATE DEHYDROGENASE DEFICIENCY (05/12/2007), HYPERLIPIDEMIA (09/10/2008), and HYPERTENSION (05/16/2007).  Encounter for well adult exam with abnormal findings Age and sex appropriate education and counseling updated with regular exercise and diet Referrals for preventative services - declines colonoscopy for now Immunizations addressed - declines covid booster Smoking counseling  - none needed Evidence for depression or other mood disorder - none significant Most recent labs reviewed. I have personally reviewed and have noted: 1) the patient's medical and social history 2) The patient's current medications and supplements 3) The patient's height, weight, and BMI have been recorded in the chart   Essential hypertension BP Readings from Last 3 Encounters:  12/05/22 122/80  11/25/22 120/82  03/24/22 114/62   Stable, pt to continue medical treatment losartan 50 mg qd   Hyperlipidemia Lab Results  Component Value Date   LDLCALC 121 (H) 12/05/2022   uncontrolled, pt for lower chol diet, declines statin   Vitamin D deficiency Last vitamin D Lab Results  Component Value Date   VD25OH 36.13 12/05/2022   Low, to start oral replacement   Allergic rhinitis Mild to mod, for depomedrol 80 mg IM,  to f/u any worsening symptoms or concerns   Dyspnea 1 min severe episode last wk, exam benign, no recurrence, I suspect mucous plug related, ok for mucinex bid prn  Daytime somnolence Also for pulm referral, r/o [facility]  Followup: Return in about 6 months (around 06/06/2023).  Cathlean Cower, MD 12/05/2022 8:46 PM [facility]

## 2022-12-05 NOTE — Patient Instructions (Addendum)
You had the steroid shot today  Also ok to take OTC Allegra and Nasacort  Please continue all other medications as before, and refills have been done if requested.  Please have the pharmacy call with any other refills you may need.  Please continue your efforts at being more active, low cholesterol diet, and weight control.  You are otherwise up to date with prevention measures today.  Please keep your appointments with your specialists as you may have planned  You will be contacted regarding the referral for: Pulmonary  Please go to the XRAY Department in the first floor for the x-ray testing  Please go to the LAB at the blood drawing area for the tests to be done  You will be contacted by phone if any changes need to be made immediately.  Otherwise, you will receive a letter about your results with an explanation, but please check with MyChart first.  Please remember to sign up for MyChart if you have not done so, as this will be important to you in the future with finding out test results, communicating by private email, and scheduling acute appointments online when needed.  Please make an Appointment to return in 6 months, or sooner if needed

## 2022-12-05 NOTE — Assessment & Plan Note (Signed)
Lab Results  Component Value Date   LDLCALC 121 (H) 12/05/2022   uncontrolled, pt for lower chol diet, declines statin

## 2022-12-07 ENCOUNTER — Encounter: Payer: Self-pay | Admitting: Internal Medicine

## 2022-12-17 ENCOUNTER — Other Ambulatory Visit: Payer: Self-pay | Admitting: Internal Medicine

## 2023-03-30 ENCOUNTER — Ambulatory Visit (INDEPENDENT_AMBULATORY_CARE_PROVIDER_SITE_OTHER): Payer: 59 | Admitting: Internal Medicine

## 2023-03-30 VITALS — BP 126/76 | HR 80 | Temp 98.6°F | Ht 72.0 in | Wt 218.8 lb

## 2023-03-30 DIAGNOSIS — E7800 Pure hypercholesterolemia, unspecified: Secondary | ICD-10-CM

## 2023-03-30 DIAGNOSIS — R002 Palpitations: Secondary | ICD-10-CM | POA: Diagnosis not present

## 2023-03-30 DIAGNOSIS — E559 Vitamin D deficiency, unspecified: Secondary | ICD-10-CM | POA: Diagnosis not present

## 2023-03-30 DIAGNOSIS — I10 Essential (primary) hypertension: Secondary | ICD-10-CM

## 2023-03-30 DIAGNOSIS — Z1211 Encounter for screening for malignant neoplasm of colon: Secondary | ICD-10-CM

## 2023-03-30 NOTE — Patient Instructions (Signed)
Please continue all other medications as before, and refills have been done if requested.  Please have the pharmacy call with any other refills you may need.  Please continue your efforts at being more active, low cholesterol diet, and weight control  Please keep your appointments with your specialists as you may have planned  You will be contacted regarding the referral for: colonoscopy  You will be contacted regarding the referral for: cardiac monitor testing  No further lab testing needed today  Please make an Appointment to return in 6 months, or sooner if needed

## 2023-03-30 NOTE — Progress Notes (Signed)
Patient ID: Jimmy Hess, male   DOB: 12-Jun-1966, 57 y.o.   MRN: [id number]        Chief Complaint: follow up ? Afib, heart fluttering, hld, low vit d, allergies, htn       HPI:  Jimmy Hess is a 57 y.o. male here with ? Of afib after his smart watch sent message to his cell phone and a short single line ECG with ? Of afib; pt has had fluttering in the chest mild intermittent recently,  Pt denies chest pain, increased sob or doe, wheezing, orthopnea, PND, increased LE swelling, dizziness or syncope.  Pt denies polydipsia, polyuria, or new focal neuro s/s.    Pt denies fever, wt loss, night sweats, loss of appetite, or other constitutional symptoms         Wt Readings from Last 3 Encounters:  03/30/23 218 lb 12.8 oz (99.2 kg)  12/05/22 225 lb (102.1 kg)  11/25/22 226 lb (102.5 kg)   BP Readings from Last 3 Encounters:  03/30/23 126/76  12/05/22 122/80  11/25/22 120/82         Past Medical History:  Diagnosis Date   ALLERGIC RHINITIS 05/16/2007   Qualifier: Diagnosis of  By: Jonny Ruiz MD, Len Blalock    Allergy    Arthritis    ERECTILE DYSFUNCTION 05/12/2007   Qualifier: Diagnosis of  By: Jonny Ruiz MD, Len Blalock    GERD 05/16/2007   Qualifier: Diagnosis of  By: Jonny Ruiz MD, Len Blalock    Glucose 6 phosphatase deficiency (HCC)    GLUCOSE-6-PHOSPHATE DEHYDROGENASE DEFICIENCY 05/12/2007   Qualifier: Diagnosis of  By: Maris Berger    HYPERLIPIDEMIA 09/10/2008   Qualifier: Diagnosis of  By: Jonny Ruiz MD, Len Blalock    HYPERTENSION 05/16/2007   Qualifier: Diagnosis of  By: Jonny Ruiz MD, Len Blalock; HTN meds discontinued by provider   Past Surgical History:  Procedure Laterality Date   FRACTURE SURGERY     ankle repair   KNEE SURGERY     NASAL SEPTUM SURGERY      reports that he has quit smoking. He has never used smokeless tobacco. He reports current alcohol use. He reports that he does not use drugs. family history includes Heart failure in his maternal grandmother; Hypertension in his maternal grandmother and  mother; Prostate cancer in his father. Allergies  Allergen Reactions   Aspirin Other (See Comments)    Cannot have due to blood deficiency   Sulfonamide Derivatives Other (See Comments)    Patient unsure   Current Outpatient Medications on File Prior to Visit  Medication Sig Dispense Refill   albuterol (VENTOLIN HFA) 108 (90 Base) MCG/ACT inhaler Inhale 2 puffs into the lungs every 6 (six) hours as needed for wheezing or shortness of breath. 1 each 5   Cholecalciferol 50 MCG (2000 UT) TABS 1 tab by mouth once daily 30 tablet 99   cyclobenzaprine (FLEXERIL) 5 MG tablet Take 1 tablet (5 mg total) by mouth 3 (three) times daily as needed. 40 tablet 1   gabapentin (NEURONTIN) 100 MG capsule Take 1 capsule (100 mg total) by mouth 3 (three) times daily. 90 capsule 5   losartan (COZAAR) 50 MG tablet TAKE 1 TABLET BY MOUTH EVERY DAY 90 tablet 3   Multiple Vitamin (MULTIVITAMIN) tablet Take 1 tablet by mouth daily.     Amino Acids (AMINO ACID PO) Take 1 tablet by mouth daily. (Patient not taking: Reported on 12/05/2022)     meloxicam (MOBIC) 15 MG tablet Take 1  tablet (15 mg total) by mouth daily as needed for pain. As needed for pain (Patient not taking: Reported on 12/05/2022) 90 tablet 3   No current facility-administered medications on file prior to visit.        ROS:  All others reviewed and negative.  Objective        PE:  BP 126/76 (BP Location: Left Arm, Patient Position: Sitting, Cuff Size: Large)   Pulse 80   Temp 98.6 F (37 C) (Oral)   Ht 6' (1.829 m)   Wt 218 lb 12.8 oz (99.2 kg)   SpO2 96%   BMI 29.67 kg/m                 Constitutional: Pt appears in NAD               HENT: Head: NCAT.                Right Ear: External ear normal.                 Left Ear: External ear normal.                Eyes: . Pupils are equal, round, and reactive to light. Conjunctivae and EOM are normal               Nose: without d/c or deformity               Neck: Neck supple. Gross normal ROM                Cardiovascular: Normal rate and regular rhythm.                 Pulmonary/Chest: Effort normal and breath sounds without rales or wheezing.                Abd:  Soft, NT, ND, + BS, no organomegaly               Neurological: Pt is alert. At baseline orientation, motor grossly intact               Skin: Skin is warm. No rashes, no other new lesions, LE edema - none               Psychiatric: Pt behavior is normal without agitation   Micro: none  Cardiac tracings I have personally interpreted today:  none  Pertinent Radiological findings (summarize): none   Lab Results  Component Value Date   WBC 5.8 12/05/2022   HGB 15.4 12/05/2022   HCT 44.8 12/05/2022   PLT 185.0 12/05/2022   GLUCOSE 85 12/05/2022   CHOL 207 (H) 12/05/2022   TRIG 129.0 12/05/2022   HDL 60.20 12/05/2022   LDLCALC 121 (H) 12/05/2022   ALT 14 12/05/2022   AST 16 12/05/2022   NA 137 12/05/2022   K 4.5 12/05/2022   CL 102 12/05/2022   CREATININE 1.10 12/05/2022   BUN 16 12/05/2022   CO2 30 12/05/2022   TSH 0.99 12/05/2022   PSA 0.49 12/05/2022   HGBA1C 4.9 12/05/2022   Assessment/Plan:  Jimmy Hess is a 57 y.o. Black or African American [2] male with  has a past medical history of ALLERGIC RHINITIS (05/16/2007), Allergy, Arthritis, ERECTILE DYSFUNCTION (05/12/2007), GERD (05/16/2007), Glucose 6 phosphatase deficiency (HCC), GLUCOSE-6-PHOSPHATE DEHYDROGENASE DEFICIENCY (05/12/2007), HYPERLIPIDEMIA (09/10/2008), and HYPERTENSION (05/16/2007).  Essential hypertension BP Readings from Last 3 Encounters:  03/30/23 126/76  12/05/22 122/80  11/25/22 120/82   Stable,  pt to continue medical treatment losartn 50 qd   Hyperlipidemia Lab Results  Component Value Date   LDLCALC 121 (H) 12/05/2022   Uncontrolled,  pt for lower chol diet, declines statin for now  Vitamin D deficiency Last vitamin D Lab Results  Component Value Date   VD25OH 36.13 12/05/2022   Low, to start oral  replacement   Palpitations Cell phone image has poor baseline and nondiagnostic for afib, but with hx of palpitations, will need Card Event monitor  Followup: Return in about 6 months (around 09/30/2023).  Oliver Barre, MD 04/01/2023 3:45 PM [facility]

## 2023-03-31 ENCOUNTER — Encounter: Payer: Self-pay | Admitting: Radiology

## 2023-04-01 ENCOUNTER — Encounter: Payer: Self-pay | Admitting: Internal Medicine

## 2023-04-01 NOTE — Assessment & Plan Note (Signed)
Cell phone image has poor baseline and nondiagnostic for afib, but with hx of palpitations, will need Card Event monitor

## 2023-04-01 NOTE — Assessment & Plan Note (Signed)
BP Readings from Last 3 Encounters:  03/30/23 126/76  12/05/22 122/80  11/25/22 120/82   Stable, pt to continue medical treatment losartn 50 qd

## 2023-04-01 NOTE — Assessment & Plan Note (Signed)
Last vitamin D Lab Results  Component Value Date   VD25OH 36.13 12/05/2022   Low, to start oral replacement

## 2023-04-01 NOTE — Assessment & Plan Note (Signed)
Lab Results  Component Value Date   LDLCALC 121 (H) 12/05/2022   Uncontrolled,  pt for lower chol diet, declines statin for now

## 2023-05-02 ENCOUNTER — Encounter: Payer: Self-pay | Admitting: Internal Medicine

## 2023-05-02 ENCOUNTER — Ambulatory Visit: Payer: 59 | Admitting: Internal Medicine

## 2023-05-02 VITALS — BP 138/82 | HR 82 | Temp 98.2°F | Ht 72.0 in | Wt 217.0 lb

## 2023-05-02 DIAGNOSIS — J019 Acute sinusitis, unspecified: Secondary | ICD-10-CM | POA: Diagnosis not present

## 2023-05-02 DIAGNOSIS — E559 Vitamin D deficiency, unspecified: Secondary | ICD-10-CM

## 2023-05-02 DIAGNOSIS — J309 Allergic rhinitis, unspecified: Secondary | ICD-10-CM | POA: Diagnosis not present

## 2023-05-02 DIAGNOSIS — I10 Essential (primary) hypertension: Secondary | ICD-10-CM | POA: Diagnosis not present

## 2023-05-02 MED ORDER — MONTELUKAST SODIUM 10 MG PO TABS: 10.0000 mg | ORAL_TABLET | Freq: Every day | ORAL | 3 refills | Status: AC

## 2023-05-02 MED ORDER — DOXYCYCLINE HYCLATE 100 MG PO TABS
100.0000 mg | ORAL_TABLET | Freq: Two times a day (BID) | ORAL | 0 refills | Status: DC
Start: 2023-05-02 — End: 2023-10-26

## 2023-05-02 MED ORDER — PREDNISONE 10 MG PO TABS: ORAL_TABLET | ORAL | 0 refills | Status: DC

## 2023-05-02 NOTE — Assessment & Plan Note (Signed)
Mild to mod, for antibx course doxy 100 bid  to f/u any worsening symptoms or concerns

## 2023-05-02 NOTE — Patient Instructions (Signed)
Please take all new medication as prescribed - the antibiotic, prednisone, and singulair 10 mg per da  Please continue all other medications as before, and refills have been done if requested.  Please have the pharmacy call with any other refills you may need.  Please keep your appointments with your specialists as you may have planned

## 2023-05-02 NOTE — Progress Notes (Signed)
Patient ID: Jimmy Hess, male   DOB: [birth year], 57 y.o.   MRN: [id number]        Chief Complaint: follow up sinusitis, allergies, htn, low vit d       HPI:  Jimmy Hess is a 57 y.o. male  Here with 2-3 days acute onset fever, facial pain, pressure, headache, general weakness and malaise, and greenish d/c, with mild ST and cough, but pt denies chest pain, wheezing, increased sob or doe, orthopnea, PND, increased LE swelling, palpitations, dizziness or syncope.  Does have several wks ongoing nasal allergy symptoms with clearish congestion, itch and sneezing, without fever, pain, ST, cough, swelling or wheezing.   Pt denies polydipsia, polyuria, or new focal neuro s/s.          Wt Readings from Last 3 Encounters:  05/02/23 217 lb (98.4 kg)  03/30/23 218 lb 12.8 oz (99.2 kg)  12/05/22 225 lb (102.1 kg)   BP Readings from Last 3 Encounters:  05/02/23 138/82  03/30/23 126/76  12/05/22 122/80         Past Medical History:  Diagnosis Date   ALLERGIC RHINITIS 05/16/2007   Qualifier: Diagnosis of  By: Jonny Ruiz MD, Len Blalock    Allergy    Arthritis    ERECTILE DYSFUNCTION 05/12/2007   Qualifier: Diagnosis of  By: Jonny Ruiz MD, Len Blalock    GERD 05/16/2007   Qualifier: Diagnosis of  By: Jonny Ruiz MD, Len Blalock    Glucose 6 phosphatase deficiency (HCC)    GLUCOSE-6-PHOSPHATE DEHYDROGENASE DEFICIENCY 05/12/2007   Qualifier: Diagnosis of  By: Maris Berger    HYPERLIPIDEMIA 09/10/2008   Qualifier: Diagnosis of  By: Jonny Ruiz MD, Len Blalock    HYPERTENSION 05/16/2007   Qualifier: Diagnosis of  By: Jonny Ruiz MD, Len Blalock; HTN meds discontinued by provider   Past Surgical History:  Procedure Laterality Date   FRACTURE SURGERY     ankle repair   KNEE SURGERY     NASAL SEPTUM SURGERY      reports that he has quit smoking. He has never used smokeless tobacco. He reports current alcohol use. He reports that he does not use drugs. family history includes Heart failure in his maternal grandmother; Hypertension in his  maternal grandmother and mother; Prostate cancer in his father. Allergies  Allergen Reactions   Aspirin Other (See Comments)    Cannot have due to blood deficiency   Sulfonamide Derivatives Other (See Comments)    Patient unsure   Current Outpatient Medications on File Prior to Visit  Medication Sig Dispense Refill   albuterol (VENTOLIN HFA) 108 (90 Base) MCG/ACT inhaler Inhale 2 puffs into the lungs every 6 (six) hours as needed for wheezing or shortness of breath. 1 each 5   Amino Acids (AMINO ACID PO) Take 1 tablet by mouth daily.     Cholecalciferol 50 MCG (2000 UT) TABS 1 tab by mouth once daily 30 tablet 99   cyclobenzaprine (FLEXERIL) 5 MG tablet Take 1 tablet (5 mg total) by mouth 3 (three) times daily as needed. 40 tablet 1   gabapentin (NEURONTIN) 100 MG capsule Take 1 capsule (100 mg total) by mouth 3 (three) times daily. 90 capsule 5   losartan (COZAAR) 50 MG tablet TAKE 1 TABLET BY MOUTH EVERY DAY 90 tablet 3   meloxicam (MOBIC) 15 MG tablet Take 1 tablet (15 mg total) by mouth daily as needed for pain. As needed for pain 90 tablet 3   Multiple Vitamin (MULTIVITAMIN) tablet Take 1  tablet by mouth daily.     No current facility-administered medications on file prior to visit.        ROS:  All others reviewed and negative.  Objective        PE:  BP 138/82 (BP Location: Left Arm, Patient Position: Sitting, Cuff Size: Large)   Pulse 82   Temp 98.2 F (36.8 C) (Oral)   Ht 6' (1.829 m)   Wt 217 lb (98.4 kg)   SpO2 96%   BMI 29.43 kg/m                 Constitutional: Pt appears in NAD               HENT: Head: NCAT.                Right Ear: External ear normal.                 Left Ear: External ear normal. Bilat tm's with mild erythema.  Max sinus areas mild tender.  Pharynx with mild erythema, no exudate               Eyes: . Pupils are equal, round, and reactive to light. Conjunctivae and EOM are normal               Nose: without d/c or deformity                Neck: Neck supple. Gross normal ROM               Cardiovascular: Normal rate and regular rhythm.                 Pulmonary/Chest: Effort normal and breath sounds without rales or wheezing.                Abd:  Soft, NT, ND, + BS, no organomegaly               Neurological: Pt is alert. At baseline orientation, motor grossly intact               Skin: Skin is warm. No rashes, no other new lesions, LE edema - none               Psychiatric: Pt behavior is normal without agitation   Micro: none  Cardiac tracings I have personally interpreted today:  none  Pertinent Radiological findings (summarize): none   Lab Results  Component Value Date   WBC 5.8 12/05/2022   HGB 15.4 12/05/2022   HCT 44.8 12/05/2022   PLT 185.0 12/05/2022   GLUCOSE 85 12/05/2022   CHOL 207 (H) 12/05/2022   TRIG 129.0 12/05/2022   HDL 60.20 12/05/2022   LDLCALC 121 (H) 12/05/2022   ALT 14 12/05/2022   AST 16 12/05/2022   NA 137 12/05/2022   K 4.5 12/05/2022   CL 102 12/05/2022   CREATININE 1.10 12/05/2022   BUN 16 12/05/2022   CO2 30 12/05/2022   TSH 0.99 12/05/2022   PSA 0.49 12/05/2022   HGBA1C 4.9 12/05/2022   Assessment/Plan:  Jimmy Hess is a 57 y.o. Black or African American [2] male with  has a past medical history of ALLERGIC RHINITIS (05/16/2007), Allergy, Arthritis, ERECTILE DYSFUNCTION (05/12/2007), GERD (05/16/2007), Glucose 6 phosphatase deficiency (HCC), GLUCOSE-6-PHOSPHATE DEHYDROGENASE DEFICIENCY (05/12/2007), HYPERLIPIDEMIA (09/10/2008), and HYPERTENSION (05/16/2007).  Acute sinus infection Mild to mod, for antibx course doxy 100 bid  to f/u any worsening symptoms or concerns  Allergic rhinitis  Mild to mod, for prednisone taper, and singulair 10 every day,,  to f/u any worsening symptoms or concerns  Essential hypertension BP Readings from Last 3 Encounters:  05/02/23 138/82  03/30/23 126/76  12/05/22 122/80   Stable, pt to continue medical treatment losartan 50 qd   Vitamin D  deficiency Last vitamin D Lab Results  Component Value Date   VD25OH 36.13 12/05/2022   Low, to start oral replacement  Followup: Return if symptoms worsen or fail to improve.  Oliver Barre, MD 05/02/2023 8:45 PM [facility]

## 2023-05-02 NOTE — Assessment & Plan Note (Signed)
BP Readings from Last 3 Encounters:  05/02/23 138/82  03/30/23 126/76  12/05/22 122/80   Stable, pt to continue medical treatment losartan 50 qd

## 2023-05-02 NOTE — Assessment & Plan Note (Signed)
Mild to mod, for prednisone taper, and singulair 10 every day,,  to f/u any worsening symptoms or concerns

## 2023-05-02 NOTE — Assessment & Plan Note (Signed)
Last vitamin D Lab Results  Component Value Date   VD25OH 36.13 12/05/2022   Low, to start oral replacement

## 2023-08-15 ENCOUNTER — Telehealth: Payer: Self-pay | Admitting: Internal Medicine

## 2023-08-15 DIAGNOSIS — R400 Somnolence: Secondary | ICD-10-CM

## 2023-08-15 NOTE — Telephone Encounter (Signed)
Ok I have referred to pulmonary who determines the testing and treatment,   thanks

## 2023-08-15 NOTE — Telephone Encounter (Signed)
Patient wants to know if he can be referred for a sleep study but he wants to do the at home test.  Please advise:  [phone]

## 2023-09-28 ENCOUNTER — Ambulatory Visit: Payer: 59 | Admitting: Internal Medicine

## 2023-10-16 ENCOUNTER — Institutional Professional Consult (permissible substitution): Payer: 59 | Admitting: Internal Medicine

## 2023-10-17 ENCOUNTER — Encounter: Payer: Self-pay | Admitting: Internal Medicine

## 2023-10-17 ENCOUNTER — Ambulatory Visit: Payer: 59 | Admitting: Internal Medicine

## 2023-10-17 VITALS — BP 116/82 | HR 70 | Temp 97.6°F | Ht 72.0 in | Wt 226.0 lb

## 2023-10-17 DIAGNOSIS — G4733 Obstructive sleep apnea (adult) (pediatric): Secondary | ICD-10-CM | POA: Diagnosis not present

## 2023-10-17 NOTE — Patient Instructions (Signed)
Recommend home sleep study to assess for sleep apnea  Avoid Allergens and Irritants Avoid secondhand smoke Avoid SICK contacts Recommend  Masking  when appropriate Recommend Keep up-to-date with vaccinations   Be aware of reduced alertness and do not drive or operate heavy machinery if experiencing this or drowsiness.  Exercise encouraged, as tolerated. Encouraged proper weight management.  Important to get eight or more hours of sleep  Limiting the use of the computer and television before bedtime.  Decrease naps during the day, so night time sleep will become enhanced.  Limit caffeine, and sleep deprivation.

## 2023-10-17 NOTE — Progress Notes (Unsigned)
Name: Jimmy Hess MRN: [id number] DOB: December 21, 1965    CHIEF COMPLAINT:  EXCESSIVE DAYTIME SLEEPINESS Assessment of OSA   HISTORY OF PRESENT ILLNESS: Patient is seen today for problems and issues with sleep related to excessive daytime sleepiness Patient  has been having sleep problems for many years Patient has been having excessive daytime sleepiness for a long time Patient has been having extreme fatigue and tiredness, lack of energy +  very Loud snoring every night + struggling breathe at night and gasps for air + morning headaches + Nonrefreshing sleep  Discussed sleep data and reviewed with patient.  Encouraged proper weight management.  Discussed driving precautions and its relationship with hypersomnolence.  Discussed operating dangerous equipment and its relationship with hypersomnolence.  Discussed sleep hygiene, and benefits of a fixed sleep waked time.  The importance of getting eight or more hours of sleep discussed with patient.  Discussed limiting the use of the computer and television before bedtime.  Decrease naps during the day, so night time sleep will become enhanced.  Limit caffeine, and sleep deprivation.  HTN, stroke, and heart failure are potential risk factors.    EPWORTH SLEEP SCORE 13     No evidence of heart failure at this time No evidence or signs of infection at this time No respiratory distress No fevers, chills, nausea, vomiting, diarrhea No evidence of lower extremity edema No evidence hemoptysis    PAST MEDICAL HISTORY :   has a past medical history of ALLERGIC RHINITIS (05/16/2007), Allergy, Arthritis, ERECTILE DYSFUNCTION (05/12/2007), GERD (05/16/2007), Glucose 6 phosphatase deficiency (HCC), GLUCOSE-6-PHOSPHATE DEHYDROGENASE DEFICIENCY (05/12/2007), HYPERLIPIDEMIA (09/10/2008), and HYPERTENSION (05/16/2007).  has a past surgical history that includes Nasal septum surgery; Fracture surgery; and Knee surgery. Prior to Admission  medications   Medication Sig Start Date End Date Taking? Authorizing Provider  albuterol (VENTOLIN HFA) 108 (90 Base) MCG/ACT inhaler Inhale 2 puffs into the lungs every 6 (six) hours as needed for wheezing or shortness of breath. 03/23/21   Corwin Levins, MD  Amino Acids (AMINO ACID PO) Take 1 tablet by mouth daily.    [provider]  Cholecalciferol 50 MCG (2000 UT) TABS 1 tab by mouth once daily 03/23/21   Corwin Levins, MD  cyclobenzaprine (FLEXERIL) 5 MG tablet Take 1 tablet (5 mg total) by mouth 3 (three) times daily as needed. 02/16/22   Corwin Levins, MD  doxycycline (VIBRA-TABS) 100 MG tablet Take 1 tablet (100 mg total) by mouth 2 (two) times daily. 05/02/23   Corwin Levins, MD  gabapentin (NEURONTIN) 100 MG capsule Take 1 capsule (100 mg total) by mouth 3 (three) times daily. 03/23/21   Corwin Levins, MD  losartan (COZAAR) 50 MG tablet TAKE 1 TABLET BY MOUTH EVERY DAY 12/20/22   Corwin Levins, MD  meloxicam (MOBIC) 15 MG tablet Take 1 tablet (15 mg total) by mouth daily as needed for pain. As needed for pain 02/16/22   Corwin Levins, MD  montelukast (SINGULAIR) 10 MG tablet Take 1 tablet (10 mg total) by mouth at bedtime. 05/02/23   Corwin Levins, MD  Multiple Vitamin (MULTIVITAMIN) tablet Take 1 tablet by mouth daily.    [provider]  predniSONE (DELTASONE) 10 MG tablet 3 tabs by mouth per day for 3 days,2tabs per day for 3 days,1tab per day for 3 days 05/02/23   Corwin Levins, MD   Allergies  Allergen Reactions   Aspirin Other (See Comments)    Cannot have  due to blood deficiency   Sulfonamide Derivatives Other (See Comments)    Patient unsure    FAMILY HISTORY:  family history includes Heart failure in his maternal grandmother; Hypertension in his maternal grandmother and mother; Prostate cancer in his father. SOCIAL HISTORY:  reports that he has quit smoking. He has never used smokeless tobacco. He reports current alcohol use. He reports that he does not use  drugs.   Review of Systems:  Gen:  Denies  fever, sweats, chills weight loss  HEENT: Denies blurred vision, double vision, ear pain, eye pain, hearing loss, nose bleeds, sore throat Cardiac:  No dizziness, chest pain or heaviness, chest tightness,edema, No JVD Resp:   No cough, -sputum production, -shortness of breath,-wheezing, -hemoptysis,  Gi: Denies swallowing difficulty, stomach pain, nausea or vomiting, diarrhea, constipation, bowel incontinence Gu:  Denies bladder incontinence, burning urine Ext:   Denies Joint pain, stiffness or swelling Skin: Denies  skin rash, easy bruising or bleeding or hives Endoc:  Denies polyuria, polydipsia , polyphagia or weight change Psych:   Denies depression, insomnia or hallucinations  Other:  All other systems negative   ALL OTHER ROS ARE NEGATIVE  BP 116/82 (BP Location: Right Arm, Patient Position: Sitting, Cuff Size: Normal)   Pulse 70   Temp 97.6 F (36.4 C) (Temporal)   Ht 6' (1.829 m)   Wt 226 lb (102.5 kg)   SpO2 97%   BMI 30.65 kg/m     Physical Examination:   General Appearance: No distress  EYES PERRLA, EOM intact.   NECK Supple, No JVD ORAL CAVITY MALLAMPATI 4 Pulmonary: normal breath sounds, No wheezing.  CardiovascularNormal S1,S2.  No m/r/g.   Abdomen: Benign, Soft, non-tender. Skin:   warm, no rashes, no ecchymosis  Extremities: normal, no cyanosis, clubbing. Neuro:without focal findings,  speech normal  PSYCHIATRIC: Mood, affect within normal limits.   ALL OTHER ROS ARE NEGATIVE    ASSESSMENT AND PLAN SYNOPSIS  58 year old patient with signs and symptoms of excessive daytime sleepiness with probable underlying diagnosis of obstructive sleep apnea    Recommend Sleep Study for definitve diagnosis Recommend home sleep studies Be aware of reduced alertness and do not drive or operate heavy machinery if experiencing this or drowsiness.  Exercise encouraged, as tolerated. Encouraged proper weight  management.  Important to get eight or more hours of sleep  Limiting the use of the computer and television before bedtime.  Decrease naps during the day, so night time sleep will become enhanced.  Limit caffeine, and sleep deprivation.    MEDICATION ADJUSTMENTS/LABS AND TESTS ORDERED: Recommend home sleep Study Avoid Allergens and Irritants Avoid secondhand smoke Avoid SICK contacts Recommend  Masking  when appropriate Recommend Keep up-to-date with vaccinations   CURRENT MEDICATIONS REVIEWED AT LENGTH WITH PATIENT TODAY   Patient  satisfied with Plan of action and management. All questions answered  Follow up  3 months   I spent a total of  62 minutes reviewing chart data, face-to-face evaluation with the patient, counseling and coordination of care as detailed above.    Lucie Leather, M.D.  [facility]  Medical Director [facility] Medical Director [facility] Cardio-Pulmonary Department

## 2023-10-26 ENCOUNTER — Ambulatory Visit: Payer: 59 | Admitting: Internal Medicine

## 2023-10-26 ENCOUNTER — Encounter: Payer: Self-pay | Admitting: Internal Medicine

## 2023-10-26 VITALS — BP 128/80 | HR 92 | Temp 98.3°F | Ht 72.0 in | Wt 223.0 lb

## 2023-10-26 DIAGNOSIS — N529 Male erectile dysfunction, unspecified: Secondary | ICD-10-CM | POA: Diagnosis not present

## 2023-10-26 DIAGNOSIS — E7800 Pure hypercholesterolemia, unspecified: Secondary | ICD-10-CM | POA: Diagnosis not present

## 2023-10-26 DIAGNOSIS — R040 Epistaxis: Secondary | ICD-10-CM

## 2023-10-26 DIAGNOSIS — Z1211 Encounter for screening for malignant neoplasm of colon: Secondary | ICD-10-CM

## 2023-10-26 DIAGNOSIS — R739 Hyperglycemia, unspecified: Secondary | ICD-10-CM | POA: Diagnosis not present

## 2023-10-26 DIAGNOSIS — I10 Essential (primary) hypertension: Secondary | ICD-10-CM

## 2023-10-26 DIAGNOSIS — E291 Testicular hypofunction: Secondary | ICD-10-CM

## 2023-10-26 DIAGNOSIS — Z0001 Encounter for general adult medical examination with abnormal findings: Secondary | ICD-10-CM | POA: Diagnosis not present

## 2023-10-26 DIAGNOSIS — J309 Allergic rhinitis, unspecified: Secondary | ICD-10-CM

## 2023-10-26 DIAGNOSIS — Z125 Encounter for screening for malignant neoplasm of prostate: Secondary | ICD-10-CM

## 2023-10-26 DIAGNOSIS — E538 Deficiency of other specified B group vitamins: Secondary | ICD-10-CM | POA: Diagnosis not present

## 2023-10-26 DIAGNOSIS — E559 Vitamin D deficiency, unspecified: Secondary | ICD-10-CM

## 2023-10-26 DIAGNOSIS — Z0000 Encounter for general adult medical examination without abnormal findings: Secondary | ICD-10-CM

## 2023-10-26 LAB — CBC WITH DIFFERENTIAL/PLATELET
Basophils Absolute: 0 10*3/uL (ref 0.0–0.1)
Basophils Relative: 0.5 % (ref 0.0–3.0)
Eosinophils Absolute: 0.1 10*3/uL (ref 0.0–0.7)
Eosinophils Relative: 1.9 % (ref 0.0–5.0)
HCT: 40.8 % (ref 39.0–52.0)
Hemoglobin: 13.8 g/dL (ref 13.0–17.0)
Lymphocytes Relative: 34.7 % (ref 12.0–46.0)
Lymphs Abs: 1.5 10*3/uL (ref 0.7–4.0)
MCHC: 33.8 g/dL (ref 30.0–36.0)
MCV: 94.8 fL (ref 78.0–100.0)
Monocytes Absolute: 0.4 10*3/uL (ref 0.1–1.0)
Monocytes Relative: 9 % (ref 3.0–12.0)
Neutro Abs: 2.3 10*3/uL (ref 1.4–7.7)
Neutrophils Relative %: 53.9 % (ref 43.0–77.0)
Platelets: 162 10*3/uL (ref 150.0–400.0)
RBC: 4.31 Mil/uL (ref 4.22–5.81)
RDW: 11.7 % (ref 11.5–15.5)
WBC: 4.3 10*3/uL (ref 4.0–10.5)

## 2023-10-26 LAB — URINALYSIS, ROUTINE W REFLEX MICROSCOPIC
Bilirubin Urine: NEGATIVE
Hgb urine dipstick: NEGATIVE
Ketones, ur: NEGATIVE
Leukocytes,Ua: NEGATIVE
Nitrite: NEGATIVE
RBC / HPF: NONE SEEN (ref 0–?)
Specific Gravity, Urine: 1.025 (ref 1.000–1.030)
Total Protein, Urine: NEGATIVE
Urine Glucose: NEGATIVE
Urobilinogen, UA: 0.2 (ref 0.0–1.0)
pH: 6 (ref 5.0–8.0)

## 2023-10-26 LAB — HEPATIC FUNCTION PANEL
ALT: 13 U/L (ref 0–53)
AST: 19 U/L (ref 0–37)
Albumin: 4.3 g/dL (ref 3.5–5.2)
Alkaline Phosphatase: 61 U/L (ref 39–117)
Bilirubin, Direct: 0.1 mg/dL (ref 0.0–0.3)
Total Bilirubin: 0.6 mg/dL (ref 0.2–1.2)
Total Protein: 6.9 g/dL (ref 6.0–8.3)

## 2023-10-26 LAB — BASIC METABOLIC PANEL
BUN: 13 mg/dL (ref 6–23)
CO2: 30 meq/L (ref 19–32)
Calcium: 9.2 mg/dL (ref 8.4–10.5)
Chloride: 102 meq/L (ref 96–112)
Creatinine, Ser: 1.1 mg/dL (ref 0.40–1.50)
GFR: 74.3 mL/min (ref >60.00)
Glucose, Bld: 93 mg/dL (ref 70–99)
Potassium: 4.1 meq/L (ref 3.5–5.1)
Sodium: 139 meq/L (ref 135–145)

## 2023-10-26 LAB — VITAMIN D 25 HYDROXY (VIT D DEFICIENCY, FRACTURES): VITD: 31.79 ng/mL (ref 30.00–100.00)

## 2023-10-26 LAB — LIPID PANEL
Cholesterol: 181 mg/dL (ref 0–200)
HDL: 48.5 mg/dL (ref >39.00)
LDL Cholesterol: 81 mg/dL (ref 0–99)
NonHDL: 132.06
Total CHOL/HDL Ratio: 4
Triglycerides: 256 mg/dL — ABNORMAL HIGH (ref 0.0–149.0)
VLDL: 51.2 mg/dL — ABNORMAL HIGH (ref 0.0–40.0)

## 2023-10-26 LAB — TSH: TSH: 0.96 u[IU]/mL (ref 0.35–5.50)

## 2023-10-26 LAB — HEMOGLOBIN A1C: Hgb A1c MFr Bld: 4.9 % (ref 4.6–6.5)

## 2023-10-26 LAB — PSA: PSA: 0.65 ng/mL (ref 0.10–4.00)

## 2023-10-26 LAB — MICROALBUMIN / CREATININE URINE RATIO
Creatinine,U: 125.3 mg/dL
Microalb Creat Ratio: 5.6 mg/g (ref 0.0–30.0)
Microalb, Ur: <0.7 mg/dL (ref 0.0–1.9)

## 2023-10-26 LAB — TESTOSTERONE: Testosterone: 281.21 ng/dL — ABNORMAL LOW (ref 300.00–890.00)

## 2023-10-26 LAB — VITAMIN B12: Vitamin B-12: 839 pg/mL (ref 211–911)

## 2023-10-26 MED ORDER — SILDENAFIL CITRATE 100 MG PO TABS: 50.0000 mg | ORAL_TABLET | Freq: Every day | ORAL | 11 refills | Status: AC | PRN

## 2023-10-26 MED ORDER — PREDNISONE 10 MG PO TABS: ORAL_TABLET | ORAL | 0 refills | Status: AC

## 2023-10-26 NOTE — Assessment & Plan Note (Signed)
Last vitamin D Lab Results  Component Value Date   VD25OH 36.13 12/05/2022   Low, to start oral replacement

## 2023-10-26 NOTE — Assessment & Plan Note (Signed)
 Once monthly, declines ENT referral for now

## 2023-10-26 NOTE — Assessment & Plan Note (Signed)
 Mild to mod, for depomedrol 80 mg IM, prednisone taper,  to f/u any worsening symptoms or concerns

## 2023-10-26 NOTE — Assessment & Plan Note (Signed)
 Lab Results  Component Value Date   LDLCALC 121 (H) 12/05/2022   Uncontrolled, for lower chol diet, f/u lab today

## 2023-10-26 NOTE — Progress Notes (Signed)
 Patient ID: Jimmy Hess, male   DOB: [birth year], 58 y.o.   MRN: [id number]         Chief Complaint:: wellness exam and allergies, low vit d, ED, nosebleed        HPI:  DANELL VERNO is a 58 y.o. male here for wellness exam; due for colonoscopy - states not called after last referral, o/w up to date                        Also Pt denies chest pain, increased sob or doe, wheezing, orthopnea, PND, increased LE swelling, palpitations, dizziness or syncope.   Pt denies polydipsia, polyuria, or new focal neuro s/s.   Pt denies fever, wt loss, night sweats, loss of appetite, or other constitutional symptoms  .[email or web address]  Does c/o ongoing fatigue, but denies signficant daytime hypersomnolence.  Also has mostly left nosebleeds once or twice a month without pain or fever.     Wt Readings from Last 3 Encounters:  10/26/23 223 lb (101.2 kg)  10/17/23 226 lb (102.5 kg)  05/02/23 217 lb (98.4 kg)   BP Readings from Last 3 Encounters:  10/26/23 128/80  10/17/23 116/82  05/02/23 138/82   Immunization History  Administered Date(s) Administered   PFIZER(Purple Top)SARS-COV-2 Vaccination 11/09/2019, 11/30/2019   Td 09/05/1997, 09/10/2008   Tdap 03/23/2021   Zoster Recombinant(Shingrix) 02/18/2022, 06/04/2022   Health Maintenance Due  Topic Date Due   Colonoscopy  11/15/2021      Past Medical History:  Diagnosis Date   ALLERGIC RHINITIS 05/16/2007   Qualifier: Diagnosis of  By: Jonny Ruiz MD, Len Blalock    Allergy    Arthritis    ERECTILE DYSFUNCTION 05/12/2007   Qualifier: Diagnosis of  By: Jonny Ruiz MD, Len Blalock    GERD 05/16/2007   Qualifier: Diagnosis of  By: Jonny Ruiz MD, Len Blalock    Glucose 6 phosphatase deficiency (HCC)    GLUCOSE-6-PHOSPHATE DEHYDROGENASE DEFICIENCY 05/12/2007   Qualifier: Diagnosis of  By: Maris Berger    HYPERLIPIDEMIA 09/10/2008   Qualifier: Diagnosis of  By: Jonny Ruiz MD, Len Blalock    HYPERTENSION 05/16/2007   Qualifier: Diagnosis of  By: Jonny Ruiz MD, Len Blalock; HTN meds discontinued by  provider   Past Surgical History:  Procedure Laterality Date   FRACTURE SURGERY     ankle repair   KNEE SURGERY     NASAL SEPTUM SURGERY      reports that he has quit smoking. He has never used smokeless tobacco. He reports current alcohol use. He reports that he does not use drugs. family history includes Heart failure in his maternal grandmother; Hypertension in his maternal grandmother and mother; Prostate cancer in his father. Allergies  Allergen Reactions   Aspirin Other (See Comments)    Cannot have due to blood deficiency   Doxycycline Other (See Comments)    anxiety   Sulfonamide Derivatives Other (See Comments)    Patient unsure   Current Outpatient Medications on File Prior to Visit  Medication Sig Dispense Refill   albuterol (VENTOLIN HFA) 108 (90 Base) MCG/ACT inhaler Inhale 2 puffs into the lungs every 6 (six) hours as needed for wheezing or shortness of breath. 1 each 5   Amino Acids (AMINO ACID PO) Take 1 tablet by mouth daily.     gabapentin (NEURONTIN) 100 MG capsule Take 1 capsule (100 mg total) by mouth 3 (three) times daily. 90 capsule 5   losartan (COZAAR) 50 MG tablet TAKE 1  TABLET BY MOUTH EVERY DAY 90 tablet 3   meloxicam (MOBIC) 15 MG tablet Take 1 tablet (15 mg total) by mouth daily as needed for pain. As needed for pain 90 tablet 3   montelukast (SINGULAIR) 10 MG tablet Take 1 tablet (10 mg total) by mouth at bedtime. 90 tablet 3   Multiple Vitamin (MULTIVITAMIN) tablet Take 1 tablet by mouth daily.     Cholecalciferol 50 MCG (2000 UT) TABS 1 tab by mouth once daily (Patient not taking: Reported on 10/26/2023) 30 tablet 99   cyclobenzaprine (FLEXERIL) 5 MG tablet Take 1 tablet (5 mg total) by mouth 3 (three) times daily as needed. (Patient not taking: Reported on 10/26/2023) 40 tablet 1   doxycycline (VIBRA-TABS) 100 MG tablet Take 1 tablet (100 mg total) by mouth 2 (two) times daily. (Patient not taking: Reported on 10/26/2023) 20 tablet 0   No current  facility-administered medications on file prior to visit.        ROS:  All others reviewed and negative.  Objective        PE:  BP 128/80 (BP Location: Left Arm, Patient Position: Sitting, Cuff Size: Normal)   Pulse 92   Temp 98.3 F (36.8 C) (Oral)   Ht 6' (1.829 m)   Wt 223 lb (101.2 kg)   SpO2 97%   BMI 30.24 kg/m                 Constitutional: Pt appears in NAD               HENT: Head: NCAT.                Right Ear: External ear normal.                 Left Ear: External ear normal. Bilat tm's with mild erythema.  Max sinus areas non tender.  Pharynx with mild erythema, no exudate               Eyes: . Pupils are equal, round, and reactive to light. Conjunctivae and EOM are normal               Nose: without d/c or deformity               Neck: Neck supple. Gross normal ROM               Cardiovascular: Normal rate and regular rhythm.                 Pulmonary/Chest: Effort normal and breath sounds without rales or wheezing.                Abd:  Soft, NT, ND, + BS, no organomegaly               Neurological: Pt is alert. At baseline orientation, motor grossly intact               Skin: Skin is warm. No rashes, no other new lesions, LE edema - none               Psychiatric: Pt behavior is normal without agitation   Micro: none  Cardiac tracings I have personally interpreted today:  none  Pertinent Radiological findings (summarize): none   Lab Results  Component Value Date   WBC 5.8 12/05/2022   HGB 15.4 12/05/2022   HCT 44.8 12/05/2022   PLT 185.0 12/05/2022   GLUCOSE 85 12/05/2022   CHOL 207 (H) 12/05/2022  TRIG 129.0 12/05/2022   HDL 60.20 12/05/2022   LDLCALC 121 (H) 12/05/2022   ALT 14 12/05/2022   AST 16 12/05/2022   NA 137 12/05/2022   K 4.5 12/05/2022   CL 102 12/05/2022   CREATININE 1.10 12/05/2022   BUN 16 12/05/2022   CO2 30 12/05/2022   TSH 0.99 12/05/2022   PSA 0.49 12/05/2022   HGBA1C 4.9 12/05/2022   Assessment/Plan:  KELLI EGOLF  is a 58 y.o. Black or African American [2] male with  has a past medical history of ALLERGIC RHINITIS (05/16/2007), Allergy, Arthritis, ERECTILE DYSFUNCTION (05/12/2007), GERD (05/16/2007), Glucose 6 phosphatase deficiency (HCC), GLUCOSE-6-PHOSPHATE DEHYDROGENASE DEFICIENCY (05/12/2007), HYPERLIPIDEMIA (09/10/2008), and HYPERTENSION (05/16/2007).  Encounter for well adult exam with abnormal findings Age and sex appropriate education and counseling updated with regular exercise and diet Referrals for preventative services - for colonoscopy Immunizations addressed - none needed Smoking counseling  - none needed Evidence for depression or other mood disorder - none significant Most recent labs reviewed. I have personally reviewed and have noted: 1) the patient's medical and social history 2) The patient's current medications and supplements 3) The patient's height, weight, and BMI have been recorded in the chart   Vitamin D deficiency Last vitamin D Lab Results  Component Value Date   VD25OH 36.13 12/05/2022   Low, to start oral replacement   Hyperlipidemia Lab Results  Component Value Date   LDLCALC 121 (H) 12/05/2022   Uncontrolled, for lower chol diet, f/u lab today   Essential hypertension BP Readings from Last 3 Encounters:  10/26/23 128/80  10/17/23 116/82  05/02/23 138/82   Stable, pt to continue medical treatment losartan 50 mg qd   Erectile dysfunction Mild to mod, for viagra prn, check testosterone,,  to f/u any worsening symptoms or concerns  Epistaxis, recurrent Once monthly, declines ENT referral for now  Allergic rhinitis Mild to mod, for depomedrol 80 mg IM, prednisone taper.,  to f/u any worsening symptoms or concerns  Followup: Return in about 6 months (around 04/24/2024).  Oliver Barre, MD 10/26/2023 7:48 PM [facility] Internal Medicine

## 2023-10-26 NOTE — Assessment & Plan Note (Signed)
 Mild to mod, for viagra prn, check testosterone,,  to f/u any worsening symptoms or concerns

## 2023-10-26 NOTE — Patient Instructions (Signed)
 You had the steroid shot today  Please take all new medication as prescribed - the prednisone, and viagra as needed  Please continue all other medications as before, and refills have been done if requested.  Please have the pharmacy call with any other refills you may need.  Please continue your efforts at being more active, low cholesterol diet, and weight control.  You are otherwise up to date with prevention measures today.  Please keep your appointments with your specialists as you may have planned  You will be contacted regarding the referral for: colonoscopy  Please call if you would want to see new allergist, or ENT  Please go to the LAB at the blood drawing area for the tests to be done  You will be contacted by phone if any changes need to be made immediately.  Otherwise, you will receive a letter about your results with an explanation, but please check with MyChart first.  Please make an Appointment to return in 6 months, or sooner if needed

## 2023-10-26 NOTE — Assessment & Plan Note (Signed)
 Age and sex appropriate education and counseling updated with regular exercise and diet Referrals for preventative services - for colonoscopy Immunizations addressed - none needed Smoking counseling  - none needed Evidence for depression or other mood disorder - none significant Most recent labs reviewed. I have personally reviewed and have noted: 1) the patient's medical and social history 2) The patient's current medications and supplements 3) The patient's height, weight, and BMI have been recorded in the chart

## 2023-10-26 NOTE — Assessment & Plan Note (Signed)
 BP Readings from Last 3 Encounters:  10/26/23 128/80  10/17/23 116/82  05/02/23 138/82   Stable, pt to continue medical treatment losartan 50 mg qd

## 2023-10-29 ENCOUNTER — Encounter: Payer: Self-pay | Admitting: Internal Medicine

## 2023-10-31 ENCOUNTER — Encounter: Payer: Self-pay | Admitting: Internal Medicine

## 2023-10-31 DIAGNOSIS — E291 Testicular hypofunction: Secondary | ICD-10-CM | POA: Insufficient documentation

## 2023-10-31 MED ORDER — JATENZO 237 MG PO CAPS: ORAL_CAPSULE | ORAL | 5 refills | Status: AC

## 2023-10-31 NOTE — Assessment & Plan Note (Signed)
 Low, to start jatenzo 237 bid due to fear of injections F40.231

## 2023-10-31 NOTE — Addendum Note (Signed)
 Addended by: Corwin Levins on: 10/31/2023 12:57 PM   Modules accepted: Orders, Level of Service

## 2023-11-03 ENCOUNTER — Telehealth: Payer: Self-pay

## 2023-11-03 ENCOUNTER — Other Ambulatory Visit (HOSPITAL_COMMUNITY): Payer: Self-pay

## 2023-11-03 NOTE — Telephone Encounter (Signed)
 Pharmacy Patient Advocate Encounter   Received notification from Pt Calls Messages that prior authorization for Jatenzo 237mg  caps is required/requested.   Insurance verification completed.   The patient is insured through [facility] .   Per test claim: PA required; PA submitted to above mentioned insurance via CoverMyMeds Key/confirmation #/EOC [id number] Status is pending

## 2023-11-07 NOTE — Telephone Encounter (Signed)
 Pharmacy Patient Advocate Encounter  Received notification from [facility] that Prior Authorization for Jatenzo 237 mg capsules  has been DENIED.  Full denial letter will be uploaded to the media tab. See denial reason below.   PA #/Case ID/Reference #: [id number]

## 2023-11-08 ENCOUNTER — Ambulatory Visit: Admitting: Internal Medicine

## 2023-11-08 ENCOUNTER — Encounter: Payer: Self-pay | Admitting: Internal Medicine

## 2023-11-08 VITALS — BP 136/78 | HR 70 | Temp 98.8°F | Ht 72.0 in | Wt 223.0 lb

## 2023-11-08 DIAGNOSIS — J309 Allergic rhinitis, unspecified: Secondary | ICD-10-CM

## 2023-11-08 DIAGNOSIS — E559 Vitamin D deficiency, unspecified: Secondary | ICD-10-CM

## 2023-11-08 DIAGNOSIS — I10 Essential (primary) hypertension: Secondary | ICD-10-CM

## 2023-11-08 DIAGNOSIS — J019 Acute sinusitis, unspecified: Secondary | ICD-10-CM

## 2023-11-08 MED ORDER — AMOXICILLIN-POT CLAVULANATE 875-125 MG PO TABS: 1.0000 | ORAL_TABLET | Freq: Two times a day (BID) | ORAL | 0 refills | Status: DC

## 2023-11-08 NOTE — Progress Notes (Signed)
 Patient ID: Jimmy Hess, male   DOB: 03-25-1966, 58 y.o.   MRN: [id number]        Chief Complaint: follow up sinusitis, allergies, htn, low vit d       HPI:  Jimmy Hess is a 57 y.o. male . Here with 2-3 days acute onset fever, facial pain, pressure, headache, general weakness and malaise, and greenish d/c, with mild ST and cough, but pt denies chest pain, wheezing, increased sob or doe, orthopnea, PND, increased LE swelling, palpitations, dizziness or syncope.  Does have several wks ongoing nasal allergy symptoms with clearish congestion, itch and sneezing, without fever, pain, ST, cough, swelling or wheezing.   Pt denies polydipsia, polyuria, or new focal neuro s/s.    Pt denies fever, wt loss, night sweats, loss of appetite, or other constitutional symptoms        Wt Readings from Last 3 Encounters:  11/08/23 223 lb (101.2 kg)  10/26/23 223 lb (101.2 kg)  10/17/23 226 lb (102.5 kg)   BP Readings from Last 3 Encounters:  11/08/23 136/78  10/26/23 128/80  10/17/23 116/82         Past Medical History:  Diagnosis Date   ALLERGIC RHINITIS 05/16/2007   Qualifier: Diagnosis of  By: Jonny Ruiz MD, Len Blalock    Allergy    Arthritis    ERECTILE DYSFUNCTION 05/12/2007   Qualifier: Diagnosis of  By: Jonny Ruiz MD, Len Blalock    GERD 05/16/2007   Qualifier: Diagnosis of  By: Jonny Ruiz MD, Len Blalock    Glucose 6 phosphatase deficiency (HCC)    GLUCOSE-6-PHOSPHATE DEHYDROGENASE DEFICIENCY 05/12/2007   Qualifier: Diagnosis of  By: Maris Berger    HYPERLIPIDEMIA 09/10/2008   Qualifier: Diagnosis of  By: Jonny Ruiz MD, Len Blalock    HYPERTENSION 05/16/2007   Qualifier: Diagnosis of  By: Jonny Ruiz MD, Len Blalock; HTN meds discontinued by provider   Past Surgical History:  Procedure Laterality Date   FRACTURE SURGERY     ankle repair   KNEE SURGERY     NASAL SEPTUM SURGERY      reports that he has quit smoking. He has never used smokeless tobacco. He reports current alcohol use. He reports that he does not use  drugs. family history includes Heart failure in his maternal grandmother; Hypertension in his maternal grandmother and mother; Prostate cancer in his father. Allergies  Allergen Reactions   Aspirin Other (See Comments)    Cannot have due to blood deficiency   Doxycycline Other (See Comments)    anxiety   Sulfonamide Derivatives Other (See Comments)    Patient unsure   Current Outpatient Medications on File Prior to Visit  Medication Sig Dispense Refill   losartan (COZAAR) 50 MG tablet TAKE 1 TABLET BY MOUTH EVERY DAY 90 tablet 3   albuterol (VENTOLIN HFA) 108 (90 Base) MCG/ACT inhaler Inhale 2 puffs into the lungs every 6 (six) hours as needed for wheezing or shortness of breath. (Patient not taking: Reported on 11/08/2023) 1 each 5   Amino Acids (AMINO ACID PO) Take 1 tablet by mouth daily. (Patient not taking: Reported on 11/08/2023)     gabapentin (NEURONTIN) 100 MG capsule Take 1 capsule (100 mg total) by mouth 3 (three) times daily. (Patient not taking: Reported on 11/08/2023) 90 capsule 5   meloxicam (MOBIC) 15 MG tablet Take 1 tablet (15 mg total) by mouth daily as needed for pain. As needed for pain (Patient not taking: Reported on 11/08/2023) 90 tablet 3  montelukast (SINGULAIR) 10 MG tablet Take 1 tablet (10 mg total) by mouth at bedtime. (Patient not taking: Reported on 11/08/2023) 90 tablet 3   Multiple Vitamin (MULTIVITAMIN) tablet Take 1 tablet by mouth daily. (Patient not taking: Reported on 11/08/2023)     predniSONE (DELTASONE) 10 MG tablet 3 tabs by mouth per day for 3 days,2tabs per day for 3 days,1tab per day for 3 days (Patient not taking: Reported on 11/08/2023) 18 tablet 0   sildenafil (VIAGRA) 100 MG tablet Take 0.5-1 tablets (50-100 mg total) by mouth daily as needed for erectile dysfunction. (Patient not taking: Reported on 11/08/2023) 10 tablet 11   Testosterone Undecanoate (JATENZO) 237 MG CAPS 1 tab by mouth twice per day (Patient not taking: Reported on 11/08/2023) 60 capsule 5    No current facility-administered medications on file prior to visit.        ROS:  All others reviewed and negative.  Objective        PE:  BP 136/78 (BP Location: Left Arm, Patient Position: Sitting, Cuff Size: Normal)   Pulse 70   Temp 98.8 F (37.1 C) (Oral)   Ht 6' (1.829 m)   Wt 223 lb (101.2 kg)   SpO2 97%   BMI 30.24 kg/m                 Constitutional: Pt appears in NAD               HENT: Head: NCAT.                Right Ear: External ear normal.                 Left Ear: External ear normal. Bilat tm's with mild erythema.  Max sinus areas mild tender.  Pharynx with mild erythema, no exudate                Eyes: . Pupils are equal, round, and reactive to light. Conjunctivae and EOM are normal               Nose: without d/c or deformity               Neck: Neck supple. Gross normal ROM               Cardiovascular: Normal rate and regular rhythm.                 Pulmonary/Chest: Effort normal and breath sounds without rales or wheezing.                               Neurological: Pt is alert. At baseline orientation, motor grossly intact               Skin: Skin is warm. No rashes, no other new lesions, LE edema - none               Psychiatric: Pt behavior is normal without agitation   Micro: none  Cardiac tracings I have personally interpreted today:  none  Pertinent Radiological findings (summarize): none   Lab Results  Component Value Date   WBC 4.3 10/26/2023   HGB 13.8 10/26/2023   HCT 40.8 10/26/2023   PLT 162.0 10/26/2023   GLUCOSE 93 10/26/2023   CHOL 181 10/26/2023   TRIG 256.0 (H) 10/26/2023   HDL 48.50 10/26/2023   LDLCALC 81 10/26/2023   ALT 13 10/26/2023   AST 19  10/26/2023   NA 139 10/26/2023   K 4.1 10/26/2023   CL 102 10/26/2023   CREATININE 1.10 10/26/2023   BUN 13 10/26/2023   CO2 30 10/26/2023   TSH 0.96 10/26/2023   PSA 0.65 10/26/2023   HGBA1C 4.9 10/26/2023   MICROALBUR <0.7 10/26/2023   Assessment/Plan:  Jimmy Hess  is a 58 y.o. Black or African American [2] male with  has a past medical history of ALLERGIC RHINITIS (05/16/2007), Allergy, Arthritis, ERECTILE DYSFUNCTION (05/12/2007), GERD (05/16/2007), Glucose 6 phosphatase deficiency (HCC), GLUCOSE-6-PHOSPHATE DEHYDROGENASE DEFICIENCY (05/12/2007), HYPERLIPIDEMIA (09/10/2008), and HYPERTENSION (05/16/2007).  Acute sinus infection Mild to mod, for antibx course doxycycline 100 bid,  to f/u any worsening symptoms or concerns  Allergic rhinitis Mild to mod, for start OTC allegra and /or nasacort, also refer allergy per pt reqeust,  to f/u any worsening symptoms or concerns  Essential hypertension BP Readings from Last 3 Encounters:  11/08/23 136/78  10/26/23 128/80  10/17/23 116/82   Stable, pt to continue medical treatment losartan 50 mg qd   Vitamin D deficiency Last vitamin D Lab Results  Component Value Date   VD25OH 31.79 10/26/2023   Low, to start oral replacement  Followup: Return if symptoms worsen or fail to improve.  Oliver Barre, MD 11/12/2023 7:47 AM [facility] Internal Medicine

## 2023-11-08 NOTE — Patient Instructions (Signed)
 Please take all new medication as prescribed - the antibiotic  Please continue all other medications as before, and refills have been done if requested.  Please have the pharmacy call with any other refills you may need.  Please keep your appointments with your specialists as you may have planned  You will be contacted regarding the referral for: Allergy

## 2023-11-10 NOTE — Telephone Encounter (Signed)
 Marland Kitchen

## 2023-11-12 ENCOUNTER — Encounter: Payer: Self-pay | Admitting: Internal Medicine

## 2023-11-12 NOTE — Assessment & Plan Note (Signed)
 Last vitamin D Lab Results  Component Value Date   VD25OH 31.79 10/26/2023   Low, to start oral replacement

## 2023-11-12 NOTE — Assessment & Plan Note (Signed)
 Mild to mod, for antibx course doxycycline 100 bid,  to f/u any worsening symptoms or concerns

## 2023-11-12 NOTE — Assessment & Plan Note (Signed)
 Mild to mod, for start OTC allegra and /or nasacort, also refer allergy per pt reqeust,  to f/u any worsening symptoms or concerns

## 2023-11-12 NOTE — Assessment & Plan Note (Signed)
 BP Readings from Last 3 Encounters:  11/08/23 136/78  10/26/23 128/80  10/17/23 116/82   Stable, pt to continue medical treatment losartan 50 mg qd

## 2023-11-22 ENCOUNTER — Encounter

## 2023-11-22 DIAGNOSIS — R069 Unspecified abnormalities of breathing: Secondary | ICD-10-CM | POA: Diagnosis not present

## 2023-11-22 DIAGNOSIS — G4733 Obstructive sleep apnea (adult) (pediatric): Secondary | ICD-10-CM

## 2023-11-27 ENCOUNTER — Telehealth: Payer: Self-pay

## 2023-11-27 DIAGNOSIS — G4733 Obstructive sleep apnea (adult) (pediatric): Secondary | ICD-10-CM

## 2023-11-27 NOTE — Telephone Encounter (Signed)
-----   Message from [facility] D REDDY sent at 11/27/2023 10:44 AM EDT ----- Regarding: HST results HST revealed moderate OSA, recommend starting on APAP therapy set to 4-16 cm H2O, EPR 3 with the AirTouch N30i nasal mask. Please also schedule 3 month CPAP follow up visit. Thanks ----- Message ----- From: Lilian Kapur Sent: 11/22/2023   6:00 PM EDT To: Alanda Slim, MD

## 2023-12-21 ENCOUNTER — Telehealth: Admitting: Internal Medicine

## 2023-12-21 ENCOUNTER — Telehealth: Payer: Self-pay | Admitting: Gastroenterology

## 2023-12-21 DIAGNOSIS — Z800 Family history of malignant neoplasm of digestive organs: Secondary | ICD-10-CM | POA: Diagnosis not present

## 2023-12-21 DIAGNOSIS — J309 Allergic rhinitis, unspecified: Secondary | ICD-10-CM

## 2023-12-21 DIAGNOSIS — J019 Acute sinusitis, unspecified: Secondary | ICD-10-CM | POA: Diagnosis not present

## 2023-12-21 DIAGNOSIS — E559 Vitamin D deficiency, unspecified: Secondary | ICD-10-CM | POA: Diagnosis not present

## 2023-12-21 MED ORDER — HYDROCODONE BIT-HOMATROP MBR 5-1.5 MG/5ML PO SOLN: 5.0000 mL | Freq: Four times a day (QID) | ORAL | 0 refills | Status: AC | PRN

## 2023-12-21 MED ORDER — AMOXICILLIN-POT CLAVULANATE 875-125 MG PO TABS
1.0000 | ORAL_TABLET | Freq: Two times a day (BID) | ORAL | 0 refills | Status: AC
Start: 2023-12-21 — End: ?

## 2023-12-21 MED ORDER — PREDNISONE 10 MG PO TABS
ORAL_TABLET | ORAL | 0 refills | Status: AC
Start: 2023-12-21 — End: ?

## 2023-12-21 NOTE — Progress Notes (Signed)
 Virtual Visit via Video Note  I connected with Jimmy Hess on Dec 21, 2023 at  2:00 PM EDT by a video enabled telemedicine application and verified that I am speaking with the correct person using two identifiers.  Location of all participants today Patient: at home Provider: at office   I discussed the limitations of evaluation and management by telemedicine and the availability of in person appointments. The patient expressed understanding and agreed to proceed.  History of Present Illness:  Here with 2-3 days acute onset fever, facial pain, pressure, headache, general weakness and malaise, and greenish d/c, with mild ST and cough, but pt denies chest pain, wheezing, increased sob or doe, orthopnea, PND, increased LE swelling, palpitations, dizziness or syncope.  Does have several wks ongoing nasal allergy symptoms with clearish congestion, itch and sneezing, without fever, pain, ST, cough, swelling or wheezing.  Also remind us  he has family hx of colon ca - asks for colonoscopy.   Past Medical History:  Diagnosis Date   ALLERGIC RHINITIS 05/16/2007   Qualifier: Diagnosis of  By: Autry Legions MD, Alveda Aures    Allergy    Arthritis    ERECTILE DYSFUNCTION 05/12/2007   Qualifier: Diagnosis of  By: Autry Legions MD, Alveda Aures    GERD 05/16/2007   Qualifier: Diagnosis of  By: Autry Legions MD, Alveda Aures    Glucose 6 phosphatase deficiency (HCC)    GLUCOSE-6-PHOSPHATE DEHYDROGENASE DEFICIENCY 05/12/2007   Qualifier: Diagnosis of  By: Jarold Merlin    HYPERLIPIDEMIA 09/10/2008   Qualifier: Diagnosis of  By: Autry Legions MD, Alveda Aures    HYPERTENSION 05/16/2007   Qualifier: Diagnosis of  By: Autry Legions MD, Alveda Aures; HTN meds discontinued by provider   Past Surgical History:  Procedure Laterality Date   FRACTURE SURGERY     ankle repair   KNEE SURGERY     NASAL SEPTUM SURGERY      reports that he has quit smoking. He has never used smokeless tobacco. He reports current alcohol use. He reports that he does not use drugs. family  history includes Heart failure in his maternal grandmother; Hypertension in his maternal grandmother and mother; Prostate cancer in his father. Allergies  Allergen Reactions   Aspirin  Other (See Comments)    Cannot have due to blood deficiency   Doxycycline  Other (See Comments)    anxiety   Sulfonamide Derivatives Other (See Comments)    Patient unsure   Current Outpatient Medications on File Prior to Visit  Medication Sig Dispense Refill   albuterol  (VENTOLIN  HFA) 108 (90 Base) MCG/ACT inhaler Inhale 2 puffs into the lungs every 6 (six) hours as needed for wheezing or shortness of breath. (Patient not taking: Reported on 11/08/2023) 1 each 5   Amino Acids (AMINO ACID PO) Take 1 tablet by mouth daily. (Patient not taking: Reported on 11/08/2023)     gabapentin  (NEURONTIN ) 100 MG capsule Take 1 capsule (100 mg total) by mouth 3 (three) times daily. (Patient not taking: Reported on 11/08/2023) 90 capsule 5   losartan (COZAAR) 50 MG tablet TAKE 1 TABLET BY MOUTH EVERY DAY 90 tablet 3   meloxicam  (MOBIC ) 15 MG tablet Take 1 tablet (15 mg total) by mouth daily as needed for pain. As needed for pain (Patient not taking: Reported on 11/08/2023) 90 tablet 3   montelukast  (SINGULAIR ) 10 MG tablet Take 1 tablet (10 mg total) by mouth at bedtime. (Patient not taking: Reported on 11/08/2023) 90 tablet 3   Multiple Vitamin (MULTIVITAMIN) tablet Take 1  tablet by mouth daily. (Patient not taking: Reported on 11/08/2023)     predniSONE  (DELTASONE ) 10 MG tablet 3 tabs by mouth per day for 3 days,2tabs per day for 3 days,1tab per day for 3 days (Patient not taking: Reported on 11/08/2023) 18 tablet 0   sildenafil  (VIAGRA ) 100 MG tablet Take 0.5-1 tablets (50-100 mg total) by mouth daily as needed for erectile dysfunction. (Patient not taking: Reported on 11/08/2023) 10 tablet 11   Testosterone  Undecanoate (JATENZO ) 237 MG CAPS 1 tab by mouth twice per day (Patient not taking: Reported on 11/08/2023) 60 capsule 5   No current  facility-administered medications on file prior to visit.    Observations/Objective: Alert, mild ill, appropriate mood and affect, resps normal, cn 2-12 intact, moves all 4s, no visible rash or swelling Lab Results  Component Value Date   WBC 4.3 10/26/2023   HGB 13.8 10/26/2023   HCT 40.8 10/26/2023   PLT 162.0 10/26/2023   GLUCOSE 93 10/26/2023   CHOL 181 10/26/2023   TRIG 256.0 (H) 10/26/2023   HDL 48.50 10/26/2023   LDLCALC 81 10/26/2023   ALT 13 10/26/2023   AST 19 10/26/2023   NA 139 10/26/2023   K 4.1 10/26/2023   CL 102 10/26/2023   CREATININE 1.10 10/26/2023   BUN 13 10/26/2023   CO2 30 10/26/2023   TSH 0.96 10/26/2023   PSA 0.65 10/26/2023   HGBA1C 4.9 10/26/2023   MICROALBUR <0.7 10/26/2023   Assessment and Plan: See notes  Follow Up Instructions: See notes   I discussed the assessment and treatment plan with the patient. The patient was provided an opportunity to ask questions and all were answered. The patient agreed with the plan and demonstrated an understanding of the instructions.   The patient was advised to call back or seek an in-person evaluation if the symptoms worsen or if the condition fails to improve as anticipated.   Rosalia Colonel, MD

## 2023-12-21 NOTE — Telephone Encounter (Signed)
 Good Morning Dr. General Kenner,   Patient called stating that his PCP had sent a referral for him to have a repeat colonoscopy. I advised him that his last colonoscopy was in 2018 and he had been placed on a 10 year recall and would not be due until 2028. Patient states that he believes Dr. Autry Legions sent the referral because he has a family history in his dad of colon cancer and thought he needed to come every 5 years.  Patient wanted to check just to be sure with you.   Will you please review and advise on scheduling patient?   Thank you.

## 2023-12-22 ENCOUNTER — Encounter: Payer: Self-pay | Admitting: Internal Medicine

## 2023-12-22 NOTE — Assessment & Plan Note (Signed)
 Last vitamin D Lab Results  Component Value Date   VD25OH 31.79 10/26/2023   Low, to start oral replacement

## 2023-12-22 NOTE — Assessment & Plan Note (Signed)
 Mild to mod, for antibx course augmentin , and cough med prn,,  to f/u any worsening symptoms or concerns

## 2023-12-22 NOTE — Assessment & Plan Note (Signed)
 Mild to mod, for prednisone taper, to f/u any worsening symptoms or concerns

## 2023-12-22 NOTE — Assessment & Plan Note (Signed)
 For GI referral colonoscopy

## 2023-12-22 NOTE — Patient Instructions (Signed)
 Please take all new medication as prescribed

## 2023-12-28 ENCOUNTER — Other Ambulatory Visit: Payer: Self-pay | Admitting: Internal Medicine

## 2023-12-28 ENCOUNTER — Other Ambulatory Visit: Payer: Self-pay

## 2023-12-29 NOTE — Telephone Encounter (Signed)
 Okay, thanks. If his father was dx around 80 we can book him for a colonoscopy in the [facility] if he otherwise meets criteria for the [facility]

## 2023-12-29 NOTE — Telephone Encounter (Signed)
 Dr. General Kenner,   I spoke with patient and he stated that his dad had colon cancer. He stated that he believes he was in his 33's when he was diagnosed.   Please advise.

## 2024-01-05 NOTE — Telephone Encounter (Signed)
Patient will call and schedule at a later time

## 2024-01-16 ENCOUNTER — Ambulatory Visit: Admitting: Allergy & Immunology

## 2024-01-31 ENCOUNTER — Ambulatory Visit: Payer: 59 | Admitting: Internal Medicine

## 2024-02-29 ENCOUNTER — Ambulatory Visit: Admitting: Internal Medicine

## 2024-02-29 ENCOUNTER — Encounter: Payer: Self-pay | Admitting: Internal Medicine

## 2024-02-29 VITALS — BP 130/80 | HR 67 | Temp 98.9°F | Ht 73.5 in | Wt 223.0 lb

## 2024-02-29 DIAGNOSIS — G4733 Obstructive sleep apnea (adult) (pediatric): Secondary | ICD-10-CM | POA: Diagnosis not present

## 2024-02-29 DIAGNOSIS — Z87891 Personal history of nicotine dependence: Secondary | ICD-10-CM

## 2024-02-29 NOTE — Patient Instructions (Signed)

## 2024-02-29 NOTE — Progress Notes (Signed)
 Name: Jimmy Hess MRN: [id number] DOB: 11-Dec-1965    CHIEF COMPLAINT Follow-up assessment for OSA HST RESULTS DISCUSSED WITH PATIENT AHI 16-20  HISTORY OF PRESENT ILLNESS: Discussed sleep data and reviewed with patient.  Encouraged proper weight management.  Discussed driving precautions and its relationship with hypersomnolence.  Discussed sleep hygiene, and benefits of a fixed sleep waked time.  The importance of getting eight or more hours of sleep discussed with patient.  Discussed limiting the use of the computer and television before bedtime.  Decrease naps during the day, so night time sleep will become enhanced.  Limit caffeine, and sleep deprivation.   Patient uses and benefits from therapy Using CPAP nightly and with naps Pressure setting is comfortable and is sleeping well. AHI reduced to 0.7 Auto CPAP 4-12 Patient has full facemask however has not having issues Recommend mask desensitization protocol assessment  No exacerbation at this time No evidence of heart failure at this time No evidence or signs of infection at this time No respiratory distress No fevers, chills, nausea, vomiting, diarrhea No evidence of lower extremity edema No evidence hemoptysis    PAST MEDICAL HISTORY :   has a past medical history of ALLERGIC RHINITIS (05/16/2007), Allergy, Arthritis, ERECTILE DYSFUNCTION (05/12/2007), GERD (05/16/2007), Glucose 6 phosphatase deficiency (HCC), GLUCOSE-6-PHOSPHATE DEHYDROGENASE DEFICIENCY (05/12/2007), HYPERLIPIDEMIA (09/10/2008), and HYPERTENSION (05/16/2007).  has a past surgical history that includes Nasal septum surgery; Fracture surgery; and Knee surgery. Prior to Admission medications   Medication Sig Start Date End Date Taking? Authorizing Provider  albuterol  (VENTOLIN  HFA) 108 (90 Base) MCG/ACT inhaler Inhale 2 puffs into the lungs every 6 (six) hours as needed for wheezing or shortness of breath. 03/23/21   Norleen Lynwood ORN, MD  Amino Acids (AMINO  ACID PO) Take 1 tablet by mouth daily.    [provider]  Cholecalciferol  50 MCG (2000 UT) TABS 1 tab by mouth once daily 03/23/21   Norleen Lynwood ORN, MD  cyclobenzaprine  (FLEXERIL ) 5 MG tablet Take 1 tablet (5 mg total) by mouth 3 (three) times daily as needed. 02/16/22   Norleen Lynwood ORN, MD  doxycycline  (VIBRA -TABS) 100 MG tablet Take 1 tablet (100 mg total) by mouth 2 (two) times daily. 05/02/23   Norleen Lynwood ORN, MD  gabapentin  (NEURONTIN ) 100 MG capsule Take 1 capsule (100 mg total) by mouth 3 (three) times daily. 03/23/21   Norleen Lynwood ORN, MD  losartan (COZAAR) 50 MG tablet TAKE 1 TABLET BY MOUTH EVERY DAY 12/20/22   Norleen Lynwood ORN, MD  meloxicam  (MOBIC ) 15 MG tablet Take 1 tablet (15 mg total) by mouth daily as needed for pain. As needed for pain 02/16/22   Norleen Lynwood ORN, MD  montelukast  (SINGULAIR ) 10 MG tablet Take 1 tablet (10 mg total) by mouth at bedtime. 05/02/23   Norleen Lynwood ORN, MD  Multiple Vitamin (MULTIVITAMIN) tablet Take 1 tablet by mouth daily.    [provider]  predniSONE  (DELTASONE ) 10 MG tablet 3 tabs by mouth per day for 3 days,2tabs per day for 3 days,1tab per day for 3 days 05/02/23   Norleen Lynwood ORN, MD   Allergies  Allergen Reactions   Aspirin  Other (See Comments)    Cannot have due to blood deficiency   Doxycycline  Other (See Comments)    anxiety   Sulfonamide Derivatives Other (See Comments)    Patient unsure    FAMILY HISTORY:  family history includes Heart failure in his maternal grandmother; Hypertension in his maternal grandmother and mother; Prostate  cancer in his father. SOCIAL HISTORY:  reports that he has quit smoking. He has never used smokeless tobacco. He reports current alcohol use. He reports that he does not use drugs.  BP 130/80 (BP Location: Left Arm, Patient Position: Sitting, Cuff Size: Large)   Pulse 67   Temp 98.9 F (37.2 C) (Oral)   Ht 6' 1.5 (1.867 m)   Wt 223 lb (101.2 kg)   SpO2 96%   BMI 29.02 kg/m      Review of  Systems: Gen:  Denies  fever, sweats, chills weight loss  HEENT: Denies blurred vision, double vision, ear pain, eye pain, hearing loss, nose bleeds, sore throat Cardiac:  No dizziness, chest pain or heaviness, chest tightness,edema, No JVD Resp:   No cough, -sputum production, -shortness of breath,-wheezing, -hemoptysis,  Other:  All other systems negative   Physical Examination:   General Appearance: No distress  EYES PERRLA, EOM intact.   NECK Supple, No JVD Pulmonary: normal breath sounds, No wheezing.  CardiovascularNormal S1,S2.  No m/r/g.   Abdomen: Benign, Soft, non-tender. Neurology UE/LE 5/5 strength, no focal deficits Ext pulses intact, cap refill intact ALL OTHER ROS ARE NEGATIVE    ASSESSMENT AND PLAN SYNOPSIS  58 year old patient with signs and symptoms of excessive daytime sleepiness with  underlying diagnosis of obstructive sleep apnea AHI 16-20    Assessment of OSA-recommend mask desensitization assessment referral Previous AHI 16-20 Continue CPAP as prescribed  Excellent compliance report Reviewed compliance report in detail with patient Patient definitely benefits the use of CPAP therapy as prescribed Using CPAP nightly and with naps Pressure setting is comfortable and is sleeping well. CPAP prescription 4-12 AHI reduced to 0.7  No evidence of acute heart failure at this time No respiratory distress No fevers, chills, nausea, vomiting, diarrhea No evidence hemoptysis  Patient Instructions Continue to use CPAP every night, minimum of 4-6 hours a night.  Change equipment every 30 days or as directed by DME.  Wash your tubing with warm soap and water daily, hang to dry. Wash humidifier portion weekly. Use bottled, distilled water and change daily   Be aware of reduced alertness and do not drive or operate heavy machinery if experiencing this or drowsiness.  Exercise encouraged, as tolerated. Encouraged proper weight management.  Important to get  eight or more hours of sleep  Limiting the use of the computer and television before bedtime.  Decrease naps during the day, so night time sleep will become enhanced.  Limit caffeine, and sleep deprivation.  HTN, stroke, uncontrolled diabetes and heart failure are potential risk factors.  Risk of untreated sleep apnea including cardiac arrhthymias, stroke, DM, pulm HTN.    MEDICATION ADJUSTMENTS/LABS AND TESTS ORDERED: Continue CPAP as prescribed Avoid Allergens and Irritants Avoid secondhand smoke Avoid SICK contacts Recommend  Masking  when appropriate Recommend Keep up-to-date with vaccinations   CURRENT MEDICATIONS REVIEWED AT LENGTH WITH PATIENT TODAY   Patient  satisfied with Plan of action and management. All questions answered   Follow up 6 months   I spent a total of 41 minutes dedicated to the care of this patient on the date of this encounter to include pre-visit review of records, face-to-face time with the patient discussing conditions above, post visit ordering of testing, clinical documentation with the electronic health record, making appropriate referrals as documented, and communicating necessary information to the patient's healthcare team.    The Patient requires high complexity decision making for assessment and support, frequent evaluation and titration of  therapies, application of advanced monitoring technologies and extensive interpretation of multiple databases.  Patient satisfied with Plan of action and management. All questions answered    Nickolas Alm Cellar, M.D.  [facility]  Medical Director [facility] Medical Director [facility] Cardio-Pulmonary Department

## 2024-03-13 ENCOUNTER — Other Ambulatory Visit (HOSPITAL_BASED_OUTPATIENT_CLINIC_OR_DEPARTMENT_OTHER)

## 2024-03-20 ENCOUNTER — Ambulatory Visit (HOSPITAL_BASED_OUTPATIENT_CLINIC_OR_DEPARTMENT_OTHER): Attending: Internal Medicine

## 2024-04-25 ENCOUNTER — Ambulatory Visit: Payer: 59 | Admitting: Internal Medicine
